# Patient Record
Sex: Female | Born: 1985 | Race: Black or African American | Hispanic: No | Marital: Single | Smoking: Never smoker
Health system: Southern US, Community
[De-identification: ages and names within clinical notes are randomized; demographics above are authoritative.]

## PROBLEM LIST (undated history)

## (undated) DIAGNOSIS — N926 Irregular menstruation, unspecified: Secondary | ICD-10-CM

## (undated) DIAGNOSIS — L0292 Furuncle, unspecified: Secondary | ICD-10-CM

## (undated) DIAGNOSIS — K219 Gastro-esophageal reflux disease without esophagitis: Secondary | ICD-10-CM

## (undated) HISTORY — DX: Gastro-esophageal reflux disease without esophagitis: K21.9

## (undated) HISTORY — DX: Irregular menstruation, unspecified: N92.6

## (undated) HISTORY — DX: Furuncle, unspecified: L02.92

---
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
## 2007-10-30 ENCOUNTER — Emergency Department (HOSPITAL_COMMUNITY): Admission: EM | Admit: 2007-10-30 | Discharge: 2007-10-30 | Payer: Self-pay | Admitting: Emergency Medicine

## 2009-04-21 ENCOUNTER — Other Ambulatory Visit: Admission: RE | Admit: 2009-04-21 | Discharge: 2009-04-21 | Payer: Self-pay | Admitting: Family Medicine

## 2009-04-21 ENCOUNTER — Ambulatory Visit: Payer: Self-pay | Admitting: Family Medicine

## 2009-04-22 ENCOUNTER — Encounter: Payer: Self-pay | Admitting: Family Medicine

## 2009-04-22 LAB — CONVERTED CEMR LAB
ALT: 9 units/L (ref 0–35)
CO2: 25 meq/L (ref 19–32)
Calcium: 9.2 mg/dL (ref 8.4–10.5)
Chloride: 106 meq/L (ref 96–112)
Cholesterol: 154 mg/dL (ref 0–200)
Creatinine, Ser: 0.85 mg/dL (ref 0.40–1.20)
Glucose, Bld: 82 mg/dL (ref 70–99)
Total CHOL/HDL Ratio: 2.9
Total Protein: 7 g/dL (ref 6.0–8.3)

## 2009-05-04 ENCOUNTER — Ambulatory Visit: Payer: Self-pay | Admitting: Family Medicine

## 2009-05-05 ENCOUNTER — Telehealth (INDEPENDENT_AMBULATORY_CARE_PROVIDER_SITE_OTHER): Payer: Self-pay | Admitting: *Deleted

## 2009-05-13 ENCOUNTER — Ambulatory Visit: Payer: Self-pay | Admitting: Family Medicine

## 2010-06-07 NOTE — Letter (Addendum)
Summary: Communicable Disease Form/[facility].  Communicable Disease Form/[facility].   Imported By: Lanelle Bal 05/14/2009 09:45:25  _____________________________________________________________________  External Attachment:    Type:   Image     Comment:   External Document

## 2010-06-07 NOTE — Assessment & Plan Note (Addendum)
Summary: TALK TO DR.METH   Vital Signs:  Patient profile:   25 year old female Height:      66 inches Weight:      172 pounds Pulse rate:   81 / minute BP sitting:   113 / 66  (left arm) Cuff size:   regular  Vitals Entered By: Kathlene November (May 13, 2009 1:03 PM) CC: still having some discharge but not as bad as it was. Was treated last Tuesday for [facility] Clamydia   Primary Care Provider:  Nani Gasser MD  CC:  still having some discharge but not as bad as it was. Was treated last Tuesday for GC Clamydia.  History of Present Illness: still having some discharge but not as bad as it was. Was treated last Tuesday for GC Clamydia. No vaginal irritation, still hust haveing a mild d/c and wants to make sure infection has cleared. Says her boyfriend went to be tested today. They have been using condoms since.  The azithro slurry caused diarrhea.   Current Medications (verified): 1)  Ortho Evra 150-20 Mcg/24hr Ptwk (Norelgestromin-Eth Estradiol) .... Apply One A Week and Change Weekly For 3 Weeks, Then No Patch On The 4th Week.  Allergies (verified): No Known Drug Allergies  Comments:  Nurse/Medical Assistant: The patient's medications and allergies were reviewed with the patient and were updated in the Medication and Allergy Lists. Kathlene November (May 13, 2009 1:04 PM)  Physical Exam  General:  Well-developed,well-nourished,in no acute distress; alert,appropriate and cooperative throughout examination   Impression & Recommendations:  Problem # 1:  GENITOURINARY INFECTION, CHLAMYDIA TRACHOMATIS (ICD-099.55) Will recheck to make sure the infection has cleared.  Orders: T-Chlamydia & GC Probe, Urine ([id number])  Complete Medication List: 1)  Ortho Evra 150-20 Mcg/24hr Ptwk (Norelgestromin-eth estradiol) .... Apply one a week and change weekly for 3 weeks, then no patch on the 4th week.

## 2010-06-30 ENCOUNTER — Encounter: Payer: Self-pay | Admitting: Family Medicine

## 2010-06-30 ENCOUNTER — Other Ambulatory Visit: Payer: Self-pay | Admitting: Family Medicine

## 2010-06-30 ENCOUNTER — Other Ambulatory Visit (HOSPITAL_COMMUNITY)
Admission: RE | Admit: 2010-06-30 | Discharge: 2010-06-30 | Disposition: A | Discharge status: Home / Self Care | Payer: PRIVATE HEALTH INSURANCE | Source: Ambulatory Visit | Attending: Family Medicine | Admitting: Family Medicine

## 2010-06-30 ENCOUNTER — Encounter: Payer: PRIVATE HEALTH INSURANCE | Admitting: Family Medicine

## 2010-06-30 DIAGNOSIS — K219 Gastro-esophageal reflux disease without esophagitis: Secondary | ICD-10-CM | POA: Insufficient documentation

## 2010-06-30 DIAGNOSIS — Z113 Encounter for screening for infections with a predominantly sexual mode of transmission: Secondary | ICD-10-CM | POA: Insufficient documentation

## 2010-06-30 DIAGNOSIS — M25569 Pain in unspecified knee: Secondary | ICD-10-CM

## 2010-06-30 DIAGNOSIS — Z01419 Encounter for gynecological examination (general) (routine) without abnormal findings: Secondary | ICD-10-CM | POA: Insufficient documentation

## 2010-06-30 DIAGNOSIS — N912 Amenorrhea, unspecified: Secondary | ICD-10-CM | POA: Insufficient documentation

## 2010-06-30 DIAGNOSIS — N760 Acute vaginitis: Secondary | ICD-10-CM | POA: Insufficient documentation

## 2010-06-30 LAB — CONVERTED CEMR LAB
Beta hcg, urine, semiquantitative: NEGATIVE
Hemoglobin: 14.7 g/dL (ref 12.0–15.0)
Lymphocytes Relative: 27 % (ref 12–46)
Monocytes Absolute: 0.2 10*3/uL (ref 0.1–1.0)
Monocytes Relative: 2 % — ABNORMAL LOW (ref 3–12)
Neutro Abs: 7.5 10*3/uL (ref 1.7–7.7)
RBC: 4.21 M/uL (ref 3.87–5.11)

## 2010-07-01 ENCOUNTER — Encounter: Payer: Self-pay | Admitting: Family Medicine

## 2010-07-01 LAB — CONVERTED CEMR LAB
BUN: 9 mg/dL (ref 6–23)
CO2: 22 meq/L (ref 19–32)
Calcium: 9.2 mg/dL (ref 8.4–10.5)
Chloride: 106 meq/L (ref 96–112)
Cholesterol: 130 mg/dL (ref 0–200)
Creatinine, Ser: 0.91 mg/dL (ref 0.40–1.20)
HDL: 49 mg/dL (ref >39)
Total CHOL/HDL Ratio: 2.7

## 2010-07-05 NOTE — Assessment & Plan Note (Addendum)
Summary: CPE, GERD, knee pain   Vital Signs:  Patient profile:   25 year old female Height:      66 inches Weight:      175 pounds Pulse rate:   86 / minute BP sitting:   124 / 76  (right arm) Cuff size:   regular  Vitals Entered By: Avon Gully CMA, Duncan Dull) (June 30, 2010 11:36 AM) CC: CPE and pap,no period in 3 months   Chief Complaint:  CPE and pap and no period in 3 months.  History of Present Illness: CPE and pap,no period in 3 months  Having GERD for several months. she describes it as a burning and pressure sensation in her chest.  Especially happens after going out to eat.  Using TUMS. about 1-2 times per week.  she says she definitely has trigger foods like orange juice.  No change in bowels.  No blood in the stool.  No nausea or vomiting.   Hasn't had a period in 3 months.  Usually pretty regular with her periods.  Are your interested in birth control. Occ discomfort in the Left lower quadrant.   she has also had some right knee pain.  His been bothering her for the last several months.  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  She says it bothers her most when she tries to fully flex her knee.  She also notices it bothers her more when the weather is cold.  She said less over the summer she rarely had symptoms.  She doesn't really take any medications or pain relievers for it.  Current Medications (verified): 1)  None  Allergies (verified): No Known Drug Allergies  Comments:  Nurse/Medical Assistant: The patient's medications and allergies were reviewed with the patient and were updated in the Medication and Allergy Lists. Avon Gully CMA, Duncan Dull) (June 30, 2010 11:38 AM)  Past History:  Past Medical History: Last updated: 04/21/2009 None  Past Surgical History: Last updated: 04/21/2009 None  Family History: Last updated: 04/21/2009 Grandparents with DM, Breast Ca, HTN Mother HTN.   Social History: Last updated: 04/21/2009 In school  and working.  Never Smoked Alcohol use-no Drug use-no Regular exercise-no 2 caffeinated drinsk per day.   Review of Systems       The patient complains of severe indigestion/heartburn.  The patient denies anorexia, fever, weight loss, weight gain, vision loss, decreased hearing, hoarseness, chest pain, syncope, dyspnea on exertion, peripheral edema, prolonged cough, headaches, hemoptysis, abdominal pain, melena, hematochezia, hematuria, incontinence, genital sores, muscle weakness, suspicious skin lesions, transient blindness, difficulty walking, depression, unusual weight change, abnormal bleeding, enlarged lymph nodes, angioedema, breast masses, and testicular masses.    Physical Exam  General:  Well-developed,well-nourished,in no acute distress; alert,appropriate and cooperative throughout examination Head:  Normocephalic and atraumatic without obvious abnormalities. No apparent alopecia or balding. Eyes:  No corneal or conjunctival inflammation noted. EOMI. Perrla.  Ears:  External ear exam shows no significant lesions or deformities.  Otoscopic examination reveals clear canals, tympanic membranes are intact bilaterally without bulging, retraction, inflammation or discharge. Hearing is grossly normal bilaterally. Nose:  External nasal examination shows no deformity or inflammation.  Mouth:  Oral mucosa and oropharynx without lesions or exudates.  Teeth in good repair. Neck:  No deformities, masses, or tenderness noted. Chest Wall:  No deformities, masses, or tenderness noted. Breasts:  No mass, nodules, thickening, tenderness, bulging, retraction, inflamation, nipple discharge or skin changes noted.  retracted nipple on the right.  Lungs:  Normal respiratory  effort, chest expands symmetrically. Lungs are clear to auscultation, no crackles or wheezes. Heart:  Normal rate and regular rhythm. S1 and S2 normal without gallop, murmur, click, rub or other extra sounds. Abdomen:  Bowel sounds  positive,abdomen soft and non-tender without masses, organomegaly or hernias noted. Genitalia:  Normal introitus for age, no external lesions, mucosa pink and moist, no vaginal or cervical lesions, no vaginal atrophy, no friaility or hemorrhage, normal uterus size and position, no adnexal masses or tenderness. Thick Hickmon vaginal d/c Msk:  Right knee with crepitus.  No edema.  Nontender around the patella and joint lines.  Normal flexion and extension. Pulses:  Radial 2+  Extremities:  No clubbing, cyanosis, edema, or deformity noted with normal full range of motion of all joints.   Neurologic:  No cranial nerve deficits noted. Station and gait are normal. . Sensory, motor and coordinative functions appear intact. Skin:  no rashes.   Cervical Nodes:  No lymphadenopathy noted Axillary Nodes:  No palpable lymphadenopathy Psych:  Cognition and judgment appear intact. Alert and cooperative with normal attention span and concentration. No apparent delusions, illusions, hallucinations   Impression & Recommendations:  Problem # 1:  ROUTINE GYNECOLOGICAL EXAMINATION (ICD-V72.31) Exam is normal today Will call with the pap resuls. Will check GD, chlam, and vaginitis screen.  encourage regular exercise.she also has lactose intolerance are recommended daily calcium plus thiamine D. supplement.  Problem # 2:  AMENORRHEA (ICD-626.0) she has missed her period for the last last 3 months.  We did do a urine test today which was negative.  She admits she has been very stressed lately and this early could be contributing.  I do recommend she start birth control since she is actually active.  She does not have her period in the next month she's to give [place] a call and I will refer her to GYN for further evaluation for amenorrhea. The following medications were removed from the medication list:    Ortho Evra 150-20 Mcg/24hr Ptwk (Norelgestromin-eth estradiol) .Marland Kitchen... Apply one a week and change weekly for 3 weeks,  then no patch on the 4th week. Her updated medication list for this problem includes:    Sprintec 28 0.25-35 Mg-mcg Tabs (Norgestimate-eth estradiol) .Marland Kitchen... Take 1 tablet by mouth once a day 1  Problem # 3:  GERD (ICD-530.81) discussed that the main treatment is avoiding this trigger foods.  Avoid greasy, spicy, acidic foods.  Avoid caffeine and carbonated beverages.  Avoid over eating and eating right before bedtime.  In the meantime will go ahead and start her on ranitidine b.i.d.  If she does well on this for one month she can decrease to once a day for one month period and then stop the medication or take every other day 4 as needed symptoms.  If this does not control her symptoms within one week she is to let me know. Her updated medication list for this problem includes:    Ranitidine Hcl 150 Mg Tabs (Ranitidine hcl) .Marland Kitchen... Take 1 tablet by mouth two times a day  Problem # 4:  KNEE PAIN (ICD-719.46) and concern that she may ask to have possible cartilage tear that causes pain and inflammation.  Especially with flexion of the knee.  At this point in time I did give her some exercises to do more for patellofemoral syndrome.  She is not missing some premed for the next few weeks she's to give [place] a call and I will refer her to sports medicine orthopedics referralforfurther  evaluation  Complete Medication List: 1)  Sprintec 28 0.25-35 Mg-mcg Tabs (Norgestimate-eth estradiol) .... Take 1 tablet by mouth once a day 1 2)  Ranitidine Hcl 150 Mg Tabs (Ranitidine hcl) .... Take 1 tablet by mouth two times a day  Other Orders: Urine Pregnancy Test  (11914) T-Comprehensive Metabolic Panel [id number]) T-Lipid Profile ([id number]) T-CBC w/Diff ([id number])  Patient Instructions: 1)  Start the knee exercises and if not better in 3 weeks the cal the office. Can use Tylenol for pain.  2)  It is important that you exercise reguarly at least 20 minutes 5 times a week. If you develop chest pain, have  severe difficulty breathing, or feel very tired, stop exercising immediately and seek medical attention.  3)  Take calcium +vitamin D daily.  Prescriptions: RANITIDINE HCL 150 MG TABS (RANITIDINE HCL) Take 1 tablet by mouth two times a day  #60 x 1   Entered and Authorized by:   Nani Gasser MD   Signed by:   Nani Gasser MD on 06/30/2010   Method used:   Electronically to        [facility] [address]* (retail)       [address]       [place], [place]  [address]       Ph: [phone]       Fax: [phone]   RxID:   [id number] SPRINTEC 28 0.25-35 MG-MCG TABS (NORGESTIMATE-ETH ESTRADIOL) Take 1 tablet by mouth once a day 1  #1 pack x 11   Entered and Authorized by:   Nani Gasser MD   Signed by:   Nani Gasser MD on 06/30/2010   Method used:   Electronically to        [facility] [phone]* (retail)       [address]       [place], [place]  [address]       Ph: [phone]       Fax: [phone]   RxID:   [id number]    Orders Added: 1)  Urine Pregnancy Test  [81025] 2)  T-Comprehensive Metabolic Panel [[id number]] 3)  T-Lipid Profile [[id number]] 4)  T-CBC w/Diff [[id number]] 5)  Est. Patient age 71-39 [33] 6)  Est. Patient Level III [99213]    Laboratory Results   Urine Tests  Date/Time Received: 06/30/10 Date/Time Reported: 06/30/10    Urine HCG: negative

## 2010-07-07 ENCOUNTER — Encounter (INDEPENDENT_AMBULATORY_CARE_PROVIDER_SITE_OTHER): Payer: Self-pay | Admitting: *Deleted

## 2010-07-08 ENCOUNTER — Ambulatory Visit: Payer: PRIVATE HEALTH INSURANCE

## 2010-07-11 ENCOUNTER — Ambulatory Visit (INDEPENDENT_AMBULATORY_CARE_PROVIDER_SITE_OTHER): Payer: PRIVATE HEALTH INSURANCE | Admitting: Family Medicine

## 2010-07-11 ENCOUNTER — Encounter: Payer: Self-pay | Admitting: Family Medicine

## 2010-07-11 ENCOUNTER — Telehealth: Payer: Self-pay | Admitting: Family Medicine

## 2010-07-11 DIAGNOSIS — A5609 Other chlamydial infection of lower genitourinary tract: Secondary | ICD-10-CM

## 2010-07-12 ENCOUNTER — Encounter (INDEPENDENT_AMBULATORY_CARE_PROVIDER_SITE_OTHER): Payer: Self-pay | Admitting: *Deleted

## 2010-07-19 NOTE — Progress Notes (Signed)
Summary: work note needed   Phone Note Call from Patient   Caller: Patient Summary of Call: Patient called back to the office because she forgot to get her work note while she was here in the office today. She states she is planning on returning to work on Wednesday 07/13/10. She will pick this note up first thing in the morning--Tuesday, can you please have her note ready for her and left at the front desk for her to pick up? Thanks for your help.Michaelle Copas  July 11, 2010 4:03 PM  Initial call taken by: Michaelle Copas,  July 11, 2010 4:03 PM  Follow-up for Phone Call        OK for work note for today. Can return for tomorrow.  Follow-up by: Nani Gasser MD,  July 11, 2010 4:43 PM  Additional Follow-up for Phone Call Additional follow up Details #1::        note printed Additional Follow-up by: Avon Gully CMA, Duncan Dull),  July 12, 2010 3:28 PM

## 2010-07-19 NOTE — Assessment & Plan Note (Signed)
Summary: infection   Vital Signs:  Patient profile:   25 year old female LMP:     07/06/2010 Height:      66 inches Weight:      173 pounds BMI:     28.02 O2 Sat:      100 % on Room air Temp:     98.6 degrees F oral Pulse rate:   63 / minute BP sitting:   126 / 73  (right arm) Cuff size:   large  Vitals Entered By: Francee Piccolo CMA Duncan Dull) (July 11, 2010 3:20 PM)  O2 Flow:  Room air CC: treatment for positive chlamydia test//SP Is Patient Diabetic? No LMP (date): 07/06/2010     Enter LMP: 07/06/2010 Last PAP Result Normal   Primary Care Provider:  Nani Gasser MD  CC:  treatment for positive chlamydia test//SP.  History of Present Illness: treatment for positive chlamydia test//SP  She finally started her period. Ended yesterday but didn't pick up her OCP until today. Wants to know if can start them today. She cithe metronidazole for BV.  Here for Axithro slurry.   Current Medications (verified): 1)  Sprintec 28 0.25-35 Mg-Mcg Tabs (Norgestimate-Eth Estradiol) .... Take 1 Tablet By Mouth Once A Day 1 2)  Ranitidine Hcl 150 Mg Tabs (Ranitidine Hcl) .... Take 1 Tablet By Mouth Two Times A Day 3)  Diflucan 150 Mg Tabs (Fluconazole) .... Take 1 Tablet By Mouth Once A Day  Allergies (verified): No Known Drug Allergies  Physical Exam  General:  Well-developed,well-nourished,in no acute distress; alert,appropriate and cooperative throughout examination   Impression & Recommendations:  Problem # 1:  CHLAMYDTRACHOMATIS INFECTION LOWER GU SITES (ICD-099.53) WE discussed letting her partner know so they dont' reinfedct each other. Also discussed using condoms.  Orders: Azithromycin oral (Q0144)  Complete Medication List: 1)  Sprintec 28 0.25-35 Mg-mcg Tabs (Norgestimate-eth estradiol) .... Take 1 tablet by mouth once a day 1 2)  Ranitidine Hcl 150 Mg Tabs (Ranitidine hcl) .... Take 1 tablet by mouth two times a day 3)  Diflucan 150 Mg Tabs (Fluconazole)  .... Take 1 tablet by mouth once a day   Orders Added: 1)  Est. Patient Level I [16109] 2)  Azithromycin oral [Q0144]

## 2010-07-19 NOTE — Letter (Signed)
Summary: Out of Work  [facility]  [address]   [place], [place] [address]   Phone: [phone]  Fax: [phone]    July 12, 2010   Employee:  Sharmaine S Wycoff    To Whom It May Concern:   For Medical reasons, please excuse the above named employee from work for the following dates:  Start:   07/11/10  End:    07/11/10   .Patient may return to work on 07/12/10  If you need additional information, please feel free to contact our office.         Sincerely,    Avon Gully CMA, (AAMA)

## 2010-07-19 NOTE — Letter (Signed)
Summary: Out of Work  [facility]  [address], [address]   [place], [place] [address]   Phone: [phone]  Fax: [phone]    July 12, 2010   Employee:  Caitlyn Buck    To Whom It May Concern:   For Medical reasons, please excuse the above named employee from work for the following dates:  Start:   07/11/10  End:   07/12/10.Patient may return to work on 07/13/10  If you need additional information, please feel free to contact our office.         Sincerely,    Charlann Boxer, MD  Appended Document: Out of Work 07/12/10 acm 3:32  printed wrong dates on the first letter.pt can return to work on 07/12/10

## 2012-08-01 ENCOUNTER — Encounter (HOSPITAL_COMMUNITY): Payer: Self-pay | Admitting: *Deleted

## 2012-08-01 ENCOUNTER — Emergency Department (INDEPENDENT_AMBULATORY_CARE_PROVIDER_SITE_OTHER)
Admission: EM | Admit: 2012-08-01 | Discharge: 2012-08-01 | Disposition: A | Discharge status: Home / Self Care | Payer: Self-pay | Source: Home / Self Care

## 2012-08-01 DIAGNOSIS — L02419 Cutaneous abscess of limb, unspecified: Secondary | ICD-10-CM

## 2012-08-01 DIAGNOSIS — L02415 Cutaneous abscess of right lower limb: Secondary | ICD-10-CM

## 2012-08-01 MED ORDER — HYDROCODONE-ACETAMINOPHEN 7.5-325 MG PO TABS: 1.0000 | ORAL_TABLET | ORAL | Status: DC | PRN

## 2012-08-01 MED ORDER — SULFAMETHOXAZOLE-TRIMETHOPRIM 800-160 MG PO TABS: 1.0000 | ORAL_TABLET | Freq: Two times a day (BID) | ORAL | Status: DC

## 2012-08-01 NOTE — ED Notes (Signed)
Pt  Has     Symptoms  Of a   painfull  Swollen     Boil  On the  Base  Of the  r  Buttock  Cheek       Symptoms  X  2  Days        Pt  Reports  Pain on  Palpation       Of  The  Affected  Area

## 2012-08-01 NOTE — Discharge Instructions (Signed)
Abscess An abscess is an infected area that contains a collection of pus and debris.It can occur in almost any part of the body. An abscess is also known as a furuncle or boil. CAUSES  An abscess occurs when tissue gets infected. This can occur from blockage of oil or sweat glands, infection of hair follicles, or a minor injury to the skin. As the body tries to fight the infection, pus collects in the area and creates pressure under the skin. This pressure causes pain. People with weakened immune systems have difficulty fighting infections and get certain abscesses more often.  SYMPTOMS Usually an abscess develops on the skin and becomes a painful mass that is red, warm, and tender. If the abscess forms under the skin, you may feel a moveable soft area under the skin. Some abscesses break open (rupture) on their own, but most will continue to get worse without care. The infection can spread deeper into the body and eventually into the bloodstream, causing you to feel ill.  DIAGNOSIS  Your caregiver will take your medical history and perform a physical exam. A sample of fluid may also be taken from the abscess to determine what is causing your infection. TREATMENT  Your caregiver may prescribe antibiotic medicines to fight the infection. However, taking antibiotics alone usually does not cure an abscess. Your caregiver may need to make a small cut (incision) in the abscess to drain the pus. In some cases, gauze is packed into the abscess to reduce pain and to continue draining the area. HOME CARE INSTRUCTIONS   Only take over-the-counter or prescription medicines for pain, discomfort, or fever as directed by your caregiver.  If you were prescribed antibiotics, take them as directed. Finish them even if you start to feel better.  If gauze is used, follow your caregiver's directions for changing the gauze.  To avoid spreading the infection:  Keep your draining abscess covered with a  bandage.  Wash your hands well.  Do not share personal care items, towels, or whirlpools with others.  Avoid skin contact with others.  Keep your skin and clothes clean around the abscess.  Keep all follow-up appointments as directed by your caregiver. SEEK MEDICAL CARE IF:   You have increased pain, swelling, redness, fluid drainage, or bleeding.  You have muscle aches, chills, or a general ill feeling.  You have a fever. MAKE SURE YOU:   Understand these instructions.  Will watch your condition.  Will get help right away if you are not doing well or get worse. Document Released: 02/01/2005 Document Revised: 10/24/2011 Document Reviewed: 07/07/2011 [facility] Patient Information 2013 [employer] [place], [place].  Community-Associated MRSA CA-MRSA stands for community-associated methicillin-resistant Staphylococcus aureus. MRSA is a type of bacteria that is resistant to some common antibiotics. It can cause infections in the skin and many other places in the body. Staphylococcus aureus, often called "staph," is a bacteria that normally lives on the skin or in the nose. Staph on the surface of the skin or in the nose does not cause problems. However, if the staph enters the body through a cut, wound, or break in the skin, an infection can happen. Up until recently, infections with the MRSA type of staph mainly occurred in hospitals and other healthcare settings. There are now increasing problems with MRSA infections in the community as well. Infections with MRSA may be very serious or even life-threatening. CA-MRSA is becoming more common. It is known to spread in crowded settings, in jails and prisons, and  in situations where there is close skin-to-skin contact, such as during sporting events or in locker rooms. MRSA can be spread through shared items, such as children's toys, razors, towels, or sports equipment.  °CAUSES °All staph, including MRSA, are normally harmless unless they enter the body  through a scratch, cut, or wound, such as with surgery. All staph, including MRSA, can be spread from person-to-person by touching contaminated objects or through direct contact. °· MRSA now causes illness in people who have not been in hospitals or other healthcare facilities. Cases of MRSA diseases in the community have been associated with: °· Recent antibiotic use. °· Sharing contaminated towels or clothes. °· Having active skin diseases. °· Participating in contact sports. °· Living in crowded settings. °· Intravenous (IV) drug use. °· Community-associated MRSA infections are usually skin infections, but may cause other severe illnesses. °· Staph bacteria are one of the most common causes of skin infection. However, they are also a common cause of pneumonia, bone or joint infections, and bloodstream infections. °DIAGNOSIS °Diagnosis of MRSA is done by cultures of fluid samples that may come from: °· Swabs taken from cuts or wounds in infected areas. °· Nasal swabs. °· Saliva or deep cough specimens from the lungs (sputum). °· Urine. °· Blood. °Many people are "colonized" with MRSA but have no signs of infection. This means that people carry the MRSA germ on their skin or in their nose and may never develop MRSA infection.  °TREATMENT  °Treatment varies and is based on how serious, how deep, or how extensive the infection is. For example: °· Some skin infections, such as a small boil or abscess, may be treated by draining yellowish-Bonura fluid (pus) from the site of the infection. °· Deeper or more widespread soft tissue infections are usually treated with surgery to drain pus and with antibiotic medicine given by vein or by mouth. This may be recommended even if you are pregnant. °· Serious infections may require a hospital stay. °If antibiotics are given, they may be needed for several weeks. °PREVENTION °Because many people are colonized with staph, including MRSA, preventing the spread of the bacteria from  person-to-person is most important. The best way to prevent the spread of bacteria and other germs is through proper hand washing or by using alcohol-based hand disinfectants. The following are other ways to help prevent MRSA infection within community settings.  °· Wash your hands frequently with soap and water for at least 15 seconds. Otherwise, use alcohol-based hand disinfectants when soap and water is not available. °· Make sure people who live with you wash their hands often, too. °· Do not share personal items. For example, avoid sharing razors and other personal hygiene items, towels, clothing, and athletic equipment. °· Wash and dry your clothes and bedding at the warmest temperatures recommended on the labels. °· Keep wounds covered. Pus from infected sores may contain MRSA and other bacteria. Keep cuts and abrasions clean and covered with germ-free (sterile), dry bandages until they are healed. °· If you have a wound that appears infected, ask your caregiver if a culture for MRSA and other bacteria should be done. °· If you are breastfeeding, talk to your caregiver about MRSA. You may be asked to temporarily stop breastfeeding. °HOME CARE INSTRUCTIONS  °· Take your antibiotics as directed. Finish them even if you start to feel better. °· Avoid close contact with those around you as much as possible. Do not use towels, razors, toothbrushes, bedding, or other items that   will be used by others.  To fight the infection, follow your caregiver's instructions for wound care. Wash your hands before and after changing your bandages.  If you have an intravascular device, such as a catheter, make sure you know how to care for it.  Be sure to tell any healthcare providers that you have MRSA so they are aware of your infection. SEEK IMMEDIATE MEDICAL CARE IF:  The infection appears to be getting worse. Signs include:  Increased warmth, redness, or tenderness around the wound site.  A red line that extends  from the infection site.  A dark color in the area around the infection.  Wound drainage that is tan, yellow, or green.  A bad smell coming from the wound.  You feel sick to your stomach (nauseous) and throw up (vomit) or cannot keep medicine down.  You have a fever.  Your baby is older than 3 months with a rectal temperature of 102 F (38.9 C) or higher.  Your baby is 38 months old or younger with a rectal temperature of 100.4 F (38 C) or higher.  You have difficulty breathing. MAKE SURE YOU:   Understand these instructions.  Will watch your condition.  Will get help right away if you are not doing well or get worse. Document Released: 07/28/2005 Document Revised: 07/17/2011 Document Reviewed: 07/28/2010 [facility] Patient Information 2013 [place], [place].

## 2012-08-01 NOTE — ED Provider Notes (Signed)
History     CSN: [id number]  Arrival date & time 08/01/12  1617   First MD Initiated Contact with Patient 08/01/12 1638      Chief Complaint  Patient presents with  . Recurrent Skin Infections    (Consider location/radiation/quality/duration/timing/severity/associated sxs/prior treatment) HPI Comments: 27 year old female presents with a painful skin lesion to the proximal right inner thigh. This began approximately 2 days ago. No history of trauma. Denies constitutional symptoms such as fever, chills or malaise.   History reviewed. No pertinent past medical history.  History reviewed. No pertinent past surgical history.  No family history on file.  History  Substance Use Topics  . Smoking status: Never Smoker   . Smokeless tobacco: Not on file  . Alcohol Use: No    OB History   Grav Para Term Preterm Abortions TAB SAB Ect Mult Living                  Review of Systems  Constitutional: Negative.   Respiratory: Negative.   Gastrointestinal: Negative.   Genitourinary: Negative.   Skin: Negative for rash.       See history of present illness  Neurological: Negative.   Psychiatric/Behavioral: Negative.     Allergies  Review of patient's allergies indicates no known allergies.  Home Medications   Current Outpatient Rx  Name  Route  Sig  Dispense  Refill  . HYDROcodone-acetaminophen (NORCO) 7.5-325 MG per tablet   Oral   Take 1 tablet by mouth every 4 (four) hours as needed for pain.   15 tablet   0   . sulfamethoxazole-trimethoprim (SEPTRA DS) 800-160 MG per tablet   Oral   Take 1 tablet by mouth 2 (two) times daily. X 7 days   14 tablet   0     BP 122/80  Pulse 108  Temp(Src) 99.9 F (37.7 C) (Oral)  Resp 20  SpO2 100%  LMP 07/14/2012  Physical Exam  Constitutional: She is oriented to person, place, and time. She appears well-developed and well-nourished. No distress.  Neck: Neck supple.  Cardiovascular: Normal rate.   Pulmonary/Chest:  Effort normal.  Musculoskeletal: She exhibits no edema and no tenderness.  Neurological: She is alert and oriented to person, place, and time. She exhibits normal muscle tone.  Skin: Skin is warm and dry.  Red indurated area measuring approximately 7 cm in diameter located in the proximal inner thigh, just distal to the inguinal fold. Much tenderness. There is a fistula draining small drops of purulent exudate. No extending lymphangitis or cellulitis. No surrounding lymphadenopathy.  Psychiatric: Her behavior is normal.    ED Course  INCISION AND DRAINAGE Date/Time: 08/01/2012 5:45 PM Performed by: Phineas Real, Nazaiah Navarrete Authorized by: Bradd Canary D Consent: Verbal consent obtained. Risks and benefits: risks, benefits and alternatives were discussed Consent given by: patient Patient understanding: patient states understanding of the procedure being performed Patient identity confirmed: verbally with patient Type: abscess Body area: lower extremity Location details: right leg Anesthesia: local infiltration Local anesthetic: lidocaine 2% with epinephrine Anesthetic total: 9 ml Patient sedated: no Scalpel size: 11 Incision type: single with marsupialization Complexity: complex Drainage: purulent and bloody Drainage amount: copious Wound treatment: drain placed Packing material: 1/4 in gauze   (including critical care time)  Labs Reviewed  CULTURE, ROUTINE-ABSCESS   No results found.   1. Abscess of right thigh       MDM  Septra DS one twice a day for 7 days Norco 7.5 one every 4 hours  when necessary pain #15 Keep the wound clean and dry. Return in 2 days for wound check and packing removal. Return sooner for worsening or new problems.        Hayden Rasmussen, NP 08/01/12 1810

## 2012-08-02 NOTE — ED Provider Notes (Signed)
Medical screening examination/treatment/procedure(s) were performed by resident physician or non-physician practitioner and as supervising physician I was immediately available for consultation/collaboration.   Barkley Bruns MD.   Linna Hoff, MD 08/02/12 [phone]

## 2012-08-03 ENCOUNTER — Encounter (HOSPITAL_COMMUNITY): Payer: Self-pay | Admitting: Emergency Medicine

## 2012-08-03 ENCOUNTER — Emergency Department (INDEPENDENT_AMBULATORY_CARE_PROVIDER_SITE_OTHER)
Admission: EM | Admit: 2012-08-03 | Discharge: 2012-08-03 | Disposition: A | Discharge status: Home / Self Care | Payer: Self-pay | Source: Home / Self Care | Attending: Family Medicine | Admitting: Family Medicine

## 2012-08-03 DIAGNOSIS — Z5189 Encounter for other specified aftercare: Secondary | ICD-10-CM

## 2012-08-03 MED ORDER — HYDROCODONE-ACETAMINOPHEN 5-325 MG PO TABS: 1.0000 | ORAL_TABLET | ORAL | Status: DC | PRN

## 2012-08-03 NOTE — ED Provider Notes (Signed)
History     CSN: [id number]  Arrival date & time 08/03/12  1214   First MD Initiated Contact with Patient 08/03/12 1359      Chief Complaint  Patient presents with  . Follow-up    HPI: Patient is a 27 y.o. female presenting with wound check. The history is provided by the spouse.  Wound Check This is a new problem. The current episode started more than 2 days ago. The problem has been gradually improving.  Pt seen here 2 days ago (08/01/12) for painful lesion to (R) inner thigh. I&D was performed and pt was placed on Septra DS. Pt states she has been taking her antibiotic as directed. Has noted some improvement in amount of pain but area remains TTP.   History reviewed. No pertinent past medical history.  History reviewed. No pertinent past surgical history.  No family history on file.  History  Substance Use Topics  . Smoking status: Never Smoker   . Smokeless tobacco: Not on file  . Alcohol Use: No    OB History   Grav Para Term Preterm Abortions TAB SAB Ect Mult Living                  Review of Systems  All other systems reviewed and are negative.    Allergies  Review of patient's allergies indicates no known allergies.  Home Medications   Current Outpatient Rx  Name  Route  Sig  Dispense  Refill  . HYDROcodone-acetaminophen (NORCO) 7.5-325 MG per tablet   Oral   Take 1 tablet by mouth every 4 (four) hours as needed for pain.   15 tablet   0   . sulfamethoxazole-trimethoprim (SEPTRA DS) 800-160 MG per tablet   Oral   Take 1 tablet by mouth 2 (two) times daily. X 7 days   14 tablet   0     BP 131/88  Pulse 116  Temp(Src) 99.1 F (37.3 C) (Oral)  Resp 20  SpO2 98%  LMP 07/14/2012  Physical Exam  Constitutional: She is oriented to person, place, and time. She appears well-developed and well-nourished.  HENT:  Head: Normocephalic and atraumatic.  Eyes: Conjunctivae are normal.  Cardiovascular: Normal rate.   Pulmonary/Chest: Effort normal.   Musculoskeletal: Normal range of motion.  Neurological: She is alert and oriented to person, place, and time.  Skin: Skin is warm and dry.  Packing removed. Wound to (R) inner thigh continues to drain. Predominately serous drainage. Minimal erythema, some local induration remains. Remains TTP  Psychiatric: She has a normal mood and affect.    ED Course  Procedures (including critical care time)  Labs Reviewed - No data to display No results found.   No diagnosis found.    MDM  Packing removed. Pt instructed to start warm soaks 2 to 3 times a day (at least) and to keep bulky dressing in place except when soaking. Given size of wound and remaining induration and TTP, will have pt return Monday for recheck to assure continued improvement. Instructed to continue antibiotic.         Leanne Chang, NP 08/03/12 1427

## 2012-08-03 NOTE — ED Notes (Signed)
Pt is here for a 2 day f/u and have packing removed from right glut She is alert and oriented w/no signs of acute distress.

## 2012-08-04 LAB — CULTURE, ROUTINE-ABSCESS

## 2012-08-04 NOTE — ED Provider Notes (Signed)
Medical screening examination/treatment/procedure(s) were performed by resident physician or non-physician practitioner and as supervising physician I was immediately available for consultation/collaboration.   Barkley Bruns MD.   Linna Hoff, MD 08/04/12 1106

## 2012-08-05 ENCOUNTER — Encounter (HOSPITAL_COMMUNITY): Payer: Self-pay | Admitting: Emergency Medicine

## 2012-08-05 ENCOUNTER — Emergency Department (INDEPENDENT_AMBULATORY_CARE_PROVIDER_SITE_OTHER)
Admission: EM | Admit: 2012-08-05 | Discharge: 2012-08-05 | Disposition: A | Discharge status: Home / Self Care | Payer: Self-pay | Source: Home / Self Care

## 2012-08-05 DIAGNOSIS — Z09 Encounter for follow-up examination after completed treatment for conditions other than malignant neoplasm: Secondary | ICD-10-CM

## 2012-08-05 NOTE — ED Provider Notes (Signed)
History     CSN: [id number]  Arrival date & time 08/05/12  1558   None     Chief Complaint  Patient presents with  . Wound Check    (Consider location/radiation/quality/duration/timing/severity/associated sxs/prior treatment) HPI Comments: For wound check #2 status post I&D of abscess. The patient has no complaints, she has been soaking in warm water. She continues to take her antibiotics.  Patient is a 27 y.o. female presenting with wound check.  Wound Check    History reviewed. No pertinent past medical history.  History reviewed. No pertinent past surgical history.  No family history on file.  History  Substance Use Topics  . Smoking status: Never Smoker   . Smokeless tobacco: Not on file  . Alcohol Use: No    OB History   Grav Para Term Preterm Abortions TAB SAB Ect Mult Living                  Review of Systems  All other systems reviewed and are negative.    Allergies  Review of patient's allergies indicates no known allergies.  Home Medications   Current Outpatient Rx  Name  Route  Sig  Dispense  Refill  . HYDROcodone-acetaminophen (NORCO) 7.5-325 MG per tablet   Oral   Take 1 tablet by mouth every 4 (four) hours as needed for pain.   15 tablet   0   . HYDROcodone-acetaminophen (NORCO/VICODIN) 5-325 MG per tablet   Oral   Take 1 tablet by mouth every 4 (four) hours as needed for pain.   10 tablet   0   . sulfamethoxazole-trimethoprim (SEPTRA DS) 800-160 MG per tablet   Oral   Take 1 tablet by mouth 2 (two) times daily. X 7 days   14 tablet   0     BP 115/67  Pulse 86  Temp(Src) 98.3 F (36.8 C) (Oral)  Resp 18  SpO2 99%  LMP 07/14/2012  Physical Exam  Nursing note and vitals reviewed. Constitutional: She is oriented to person, place, and time. She appears well-developed.  Neurological: She is alert and oriented to person, place, and time. She exhibits normal muscle tone.  Skin: Skin is warm and dry. No erythema.  Wound is  healing well without signs of infection. There is some maceration of the incision most likely due to soaking in water.    ED Course  Procedures (including critical care time)  Labs Reviewed - No data to display No results found.   1. Follow-up examination, following unspecified surgery       MDM  The area of the abscess is continuing to heal. There is a 2 and half centimeter incision opening that is filling in by secondary intention. No signs of infection such is Prelone drainage. No bleeding. Just beneath this incision is an area of induration measuring approximately 3 x 2 cm. It is getting smaller. No extension into the gluteal cleft or distally.        Hayden Rasmussen, NP 08/05/12 1737

## 2012-08-05 NOTE — ED Notes (Signed)
Follow up on abscess, right buttocks. Patient reports site feels much better, some soreness, but much better.

## 2012-08-05 NOTE — ED Provider Notes (Signed)
Medical screening examination/treatment/procedure(s) were performed by non-physician practitioner and as supervising physician I was immediately available for consultation/collaboration.  Raynald Blend, MD 08/05/12 [phone]

## 2012-08-09 ENCOUNTER — Telehealth (HOSPITAL_COMMUNITY): Payer: Self-pay | Admitting: *Deleted

## 2012-08-09 NOTE — ED Notes (Signed)
Abscess culture R thigh: few MRSA.  Pt. adequately treated with Septra DS. I called pt. Pt. verified x 2 and given results.  Pt. told she was adequately treated and given the [facility] MRSA instructions. Pt. 's questions answered and  She voiced understanding. Vassie Moselle 08/09/2012

## 2013-01-10 ENCOUNTER — Ambulatory Visit (INDEPENDENT_AMBULATORY_CARE_PROVIDER_SITE_OTHER): Payer: No Typology Code available for payment source | Admitting: Family Medicine

## 2013-01-10 ENCOUNTER — Encounter: Payer: Self-pay | Admitting: Family Medicine

## 2013-01-10 VITALS — BP 110/84 | Temp 98.7°F | Ht 65.5 in | Wt 184.0 lb

## 2013-01-10 DIAGNOSIS — N926 Irregular menstruation, unspecified: Secondary | ICD-10-CM | POA: Insufficient documentation

## 2013-01-10 DIAGNOSIS — K5900 Constipation, unspecified: Secondary | ICD-10-CM | POA: Insufficient documentation

## 2013-01-10 DIAGNOSIS — E669 Obesity, unspecified: Secondary | ICD-10-CM

## 2013-01-10 DIAGNOSIS — Z23 Encounter for immunization: Secondary | ICD-10-CM

## 2013-01-10 DIAGNOSIS — Z7189 Other specified counseling: Secondary | ICD-10-CM

## 2013-01-10 DIAGNOSIS — Z800 Family history of malignant neoplasm of digestive organs: Secondary | ICD-10-CM

## 2013-01-10 DIAGNOSIS — Z7689 Persons encountering health services in other specified circumstances: Secondary | ICD-10-CM

## 2013-01-10 LAB — TSH: TSH: 0.39 u[IU]/mL (ref 0.35–5.50)

## 2013-01-10 LAB — LIPID PANEL
Cholesterol: 161 mg/dL (ref 0–200)
VLDL: 17.4 mg/dL (ref 0.0–40.0)

## 2013-01-10 LAB — HEMOGLOBIN A1C: Hgb A1c MFr Bld: 3.8 % — ABNORMAL LOW (ref 4.6–6.5)

## 2013-01-10 LAB — T4, FREE: Free T4: 0.77 ng/dL (ref 0.60–1.60)

## 2013-01-10 NOTE — Patient Instructions (Signed)
-  We have ordered labs or studies at this visit. It can take up to 1-2 weeks for results and processing. We will contact you with instructions IF your results are abnormal. Normal results will be released to your [facility]. If you have not heard from [place] or can not find your results in [facility] in 2 weeks please contact our office.  -PLEASE SIGN UP FOR MYCHART TODAY   We recommend the following healthy lifestyle measures: - eat a healthy diet consisting of lots of vegetables, fruits, beans, nuts, seeds, healthy meats such as Schrag chicken and fish and whole grains.  - avoid fried foods, fast food, processed foods, sodas, red meet and other fattening foods.  - get a least 150 minutes of aerobic exercise per week.   Please let us know if period irregularity returns/worsens  -We placed a referral for you as discussed to the gastroenterologist. It usually takes about 1-2 weeks to process and schedule this referral. If you have not heard from [place] regarding this appointment in 2 weeks please contact our office.  In the meantime for your constipation: -we advise a daily fiber supplement such as metamucil -when stopped up: mirilax 1 capful in cup of liquid daily for 3 days, if not successful increase to twice daily for 3 days - goal is 1 soft bowel movement at least several times per week  Follow up in: 3 months

## 2013-01-10 NOTE — Progress Notes (Signed)
Chief Complaint  Patient presents with  . Establish Care  . Menstrual Problem    irregularity   . Constipation    HPI:  Caitlyn Buck is here to establish care. Moved to [place] recently. Last PCP and physical:  Has the following chronic problems and concerns today:  Mestrual Irr: -irregular since started periods as a teenager -Occ misses periods -missed a few periods recently the started her period today  Constipation/GERD: -chronic - her whole life even as a child had to use laxatives -does not take a fiber supplement -no changes, no blood in stools, no nausea or vomiting -GERD is rare - tums helps -multiple family members with colon cancer - would like to see GI  Patient Active Problem List   Diagnosis Date Noted  . Unspecified constipation 01/10/2013  . Family hx of colon cancer 01/10/2013  . Obesity, unspecified 01/10/2013  . Irregular menses 01/10/2013  . GERD 06/30/2010   Health Maintenance: -she will get flu flu and tdap  -had pap last year and was normal, reports never had an abnormal pap  ROS: See pertinent positives and negatives per HPI.  Past Medical History  Diagnosis Date  . GERD (gastroesophageal reflux disease)   . Menstrual irregularity     Family History  Problem Relation Age of Onset  . Cancer Mother 26    colon cancer  . Hypertension Mother   . Diabetes Maternal Grandmother   . Cancer Maternal Grandfather     colon cancer    History   Social History  . Marital Status: Single    Spouse Name: N/A    Number of Children: N/A  . Years of Education: N/A   Social History Main Topics  . Smoking status: Never Smoker   . Smokeless tobacco: None  . Alcohol Use: Yes     Comment: occ. 2-3 drinks a few times per month  . Drug Use: None  . Sexual Activity: Yes    Birth Control/ Protection: Condom   Other Topics Concern  . 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  EXAM:  Filed Vitals:   01/10/13 1433  BP: 110/84  Temp: 98.7 F (37.1 C)    Body mass index is 30.14 kg/(m^2).  GENERAL: vitals reviewed and listed above, alert, oriented, appears well hydrated and in no acute distress  HEENT: atraumatic, conjunttiva clear, no obvious abnormalities on inspection of external nose and ears  NECK: no obvious masses on inspection  LUNGS: clear to auscultation bilaterally, no wheezes, rales or rhonchi, good air movement  CV: HRRR, no peripheral edema  MS: moves all extremities without noticeable abnormality  PSYCH: pleasant and cooperative, no obvious depression or anxiety  ASSESSMENT AND PLAN:  Discussed the following assessment and plan:  Unspecified constipation - Plan: Ambulatory referral to Gastroenterology  Family hx of colon cancer - Plan: Ambulatory referral to Gastroenterology  Obesity, unspecified - Plan: Lipid Panel, TSH, T4, Free, Hemoglobin A1c  Irregular menses - Plan: TSH, T4, Free, POCT urine pregnancy  Encounter to establish care  -We reviewed the PMH, PSH, FH, SH, Meds and Allergies. -We provided refills  for any medications we will prescribe as needed. -We addressed current concerns per orders and patient instructions. -We have asked for records for pertinent exams, studies, vaccines and notes from previous providers. -We have advised patient to follow up per instructions below. -missed period occ for chronically with prior workup and used OCPs in the past, but doesn't like that they cause weight gain - discussed options, chacking upreg and tsh today, offered ocp re-initiation, but she will hold off on this - advised healthy diet, exercise and weight loss -directions for constipation and referred to GI per her  request -lifestyle recs discussed at lengthed -flu and tdap given -advised her to have pap records sent to [place] -follow up 3 months or sooner if needed   -Patient advised to return or notify a doctor immediately if symptoms worsen or persist or new concerns arise.  Patient Instructions  -We have ordered labs or studies at this visit. It can take up to 1-2 weeks for results and processing. We will contact you with instructions IF your results are abnormal. Normal results will be released to your [facility]. If you have not heard from [place] or can not find your results in [facility] in 2 weeks please contact our office.  -PLEASE SIGN UP FOR MYCHART TODAY   We recommend the following healthy lifestyle measures: - eat a healthy diet consisting of lots of vegetables, fruits, beans, nuts, seeds, healthy meats such as Mangini chicken and fish and whole grains.  - avoid fried foods, fast food, processed foods, sodas, red meet and other fattening foods.  - get a least 150 minutes of aerobic exercise per week.   Please let us know if period irregularity returns/worsens  -We placed a referral for you as discussed to the gastroenterologist. It usually takes about 1-2 weeks to process and schedule this referral. If you have not heard from [place] regarding this appointment in 2 weeks please contact our office.  In the meantime for your constipation: -we advise a daily fiber supplement such as metamucil -when stopped up: mirilax 1 capful in cup of liquid daily for 3 days, if not successful increase to twice daily for 3 days - goal is 1 soft bowel movement at least several times per week  Follow up in: 3 months      Diondre Pulis R.

## 2013-01-10 NOTE — Addendum Note (Signed)
Addended by: Azucena Freed on: 01/10/2013 03:19 PM   Modules accepted: Orders

## 2013-01-15 NOTE — Progress Notes (Signed)
Quick Note:  Called and spoke with pt and pt is aware. ______ 

## 2013-01-29 ENCOUNTER — Encounter: Payer: Self-pay | Admitting: Gastroenterology

## 2013-03-07 ENCOUNTER — Ambulatory Visit (INDEPENDENT_AMBULATORY_CARE_PROVIDER_SITE_OTHER): Payer: No Typology Code available for payment source | Admitting: Gastroenterology

## 2013-03-07 ENCOUNTER — Encounter: Payer: Self-pay | Admitting: Gastroenterology

## 2013-03-07 VITALS — BP 100/64 | HR 88 | Ht 65.5 in | Wt 182.6 lb

## 2013-03-07 DIAGNOSIS — K5900 Constipation, unspecified: Secondary | ICD-10-CM

## 2013-03-07 DIAGNOSIS — Z800 Family history of malignant neoplasm of digestive organs: Secondary | ICD-10-CM

## 2013-03-07 MED ORDER — LINACLOTIDE 145 MCG PO CAPS: 145.0000 ug | ORAL_CAPSULE | Freq: Every day | ORAL | Status: DC

## 2013-03-07 MED ORDER — MOVIPREP 100 G PO SOLR: 1.0000 | Freq: Once | ORAL | Status: DC

## 2013-03-07 NOTE — Patient Instructions (Signed)
You will be set up for a colonoscopy (LEC, moderate) for constipation and family history of colon cancer. Samples of linzess (low dose, 2 weeks, if good result we will call in prescription)

## 2013-03-07 NOTE — Progress Notes (Signed)
HPI: This is a    very pleasant 27 year old woman whom I am meeting for the first time today.  Constipated for many years.  Can usually go about 1 week without BM. Does not take regular bowel agents but does have periodic, PRN miralax.  Will have to strain a bit with BMs at first.    Never diarrhea.  . Overall stable weight Feels better, bloating, cramping gone after BM. Has sensation of incomplete evacuation.  Mom had colon cancer, diagnosed at age 31; treated with surgery.  we discussed this cancer at length. Her mom is being treated at [facility]. They explained that she has adenocarcinoma in her pelvis. It sounds as if there has been some diagnostic confusion or dilemma but they ultimately had decided that she has an unusual presentation of colon cancer rather than a gynecologic process.    Review of systems: Pertinent positive and negative review of systems were noted in the above HPI section. Complete review of systems was performed and was otherwise normal.    Past Medical History  Diagnosis Date  . GERD (gastroesophageal reflux disease)   . Menstrual irregularity     History reviewed. No pertinent past surgical history.  No current outpatient prescriptions on file.   No current facility-administered medications for this visit.    Allergies as of 03/07/2013  . (No Known Allergies)    Family History  Problem Relation Age of Onset  . Cancer Mother 25    colon cancer  . Hypertension Mother   . Diabetes Maternal Grandmother   . Cancer Maternal Grandfather     colon cancer    History   Social History  . Marital Status: Single    Spouse Name: N/A    Number of Children: N/A  . Years of Education: N/A   Occupational History  . work    Social History Main Topics  . Smoking status: Never Smoker   . Smokeless tobacco: Never Used  . Alcohol Use: Yes     Comment: occ. 2-3 drinks a few times per month  . Drug Use: No  . Sexual Activity: Yes     Birth Control/ Protection: Condom   Other Topics Concern  . 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 - [employer]      Home Situation: lives with mother      Spiritual Beliefs: Ephriam Knuckles      Lifestyle: walks 1 mile 2-3 times per week; diet is not healthy                Physical Exam: BP 100/64  Pulse 88  Ht 5' 5.5" (1.664 m)  Wt 182 lb 9.6 oz (82.827 kg)  BMI 29.91 kg/m2  SpO2 99%  LMP 02/13/2013 Constitutional: generally well-appearing Psychiatric: alert and oriented x3 Eyes: extraocular movements intact Mouth: oral pharynx moist, no lesions Neck: supple no lymphadenopathy Cardiovascular: heart regular rate and rhythm Lungs: clear to auscultation bilaterally Abdomen: soft, nontender, nondistended, no obvious ascites, no peritoneal signs, normal bowel sounds Extremities: no lower extremity edema bilaterally Skin: no lesions on visible extremities    Assessment and plan: 27 y.o. female with  constipation, family history of colon cancer in her mother at age 25.  We'll proceed with colonoscopy at her soonest convenience given her constipation and her significant family history of colon cancer. I am giving her samples of linzess to take 1 pill once daily and if  she notice improvement on have to get her a prescription for this.. I see no reason for any blood tests or imaging studies at this point.

## 2013-03-12 ENCOUNTER — Encounter: Payer: Self-pay | Admitting: Gastroenterology

## 2013-04-18 ENCOUNTER — Ambulatory Visit: Payer: No Typology Code available for payment source | Admitting: Family Medicine

## 2013-04-25 ENCOUNTER — Other Ambulatory Visit: Payer: Self-pay

## 2013-04-25 ENCOUNTER — Encounter: Payer: Self-pay | Admitting: Gastroenterology

## 2013-04-25 ENCOUNTER — Ambulatory Visit (AMBULATORY_SURGERY_CENTER): Payer: No Typology Code available for payment source | Admitting: Gastroenterology

## 2013-04-25 VITALS — BP 105/60 | HR 71 | Temp 97.3°F | Resp 20 | Ht 65.5 in | Wt 182.0 lb

## 2013-04-25 DIAGNOSIS — D126 Benign neoplasm of colon, unspecified: Secondary | ICD-10-CM

## 2013-04-25 DIAGNOSIS — Z800 Family history of malignant neoplasm of digestive organs: Secondary | ICD-10-CM

## 2013-04-25 MED ORDER — SODIUM CHLORIDE 0.9 % IV SOLN: 500.0000 mL | INTRAVENOUS | Status: DC

## 2013-04-25 MED ORDER — LINACLOTIDE 145 MCG PO CAPS: 145.0000 ug | ORAL_CAPSULE | Freq: Every day | ORAL | Status: DC

## 2013-04-25 NOTE — Op Note (Signed)
[facility] [address] [place], [address]   COLONOSCOPY PROCEDURE REPORT  PATIENT: Caitlyn Buck, Caitlyn Buck  MR#: [id number] BIRTHDATE: 03/31/1986 , 27  yrs. old GENDER: Female ENDOSCOPIST: Rachael Fee, MD REFERRED [id number] Selena Batten, D.O. PROCEDURE DATE:  04/25/2013 PROCEDURE:   Colonoscopy with snare polypectomy First Screening Colonoscopy - Avg.  risk and is 50 yrs.  old or older - No.  Prior Negative Screening - Now for repeat screening. N/A  History of Adenoma - Now for follow-up colonoscopy & has been > or = to 3 yrs.  N/A  Polyps Removed Today? Yes. ASA CLASS:   Class I INDICATIONS:elevated risk screening, mother had colon cancer diagnosed in her early 21s; also minor constipation. MEDICATIONS: Fentanyl 50 mcg IV, Versed 6 mg IV, and These medications were titrated to patient response per physician's verbal order  DESCRIPTION OF PROCEDURE:   After the risks benefits and alternatives of the procedure were thoroughly explained, informed consent was obtained.  A digital rectal exam revealed no abnormalities of the rectum.   The LB [id number]-[id number]  endoscope was introduced through the anus and advanced to the cecum, which was identified by both the appendix and ileocecal valve. No adverse events experienced.   The quality of the prep was excellent.  The instrument was then slowly withdrawn as the colon was fully examined.   COLON FINDINGS: One polyp was found, removed and sent to pathology. This was sessile, located in ascending segment, 3mm across, removed with cold snare.  The examination was otherwise normal. Retroflexed views revealed no abnormalities. The time to cecum=1 minutes 07 seconds.  Withdrawal time=7 minutes 32 seconds.  The scope was withdrawn and the procedure completed. COMPLICATIONS: There were no complications.  ENDOSCOPIC IMPRESSION: One polyp was found, removed and sent to pathology. The examination was otherwise  normal.  RECOMMENDATIONS: If the polyp(s) removed today are proven to be adenomatous (pre-cancerous) polyps, you will need a repeat colonoscopy in 5 years.  You will receive a letter within 1-2 weeks with the results of your biopsy as well as final recommendations.  Please call my office if you have not received a letter after 3 weeks.   eSigned:  Rachael Fee, MD 04/25/2013 8:15 AM

## 2013-04-25 NOTE — Progress Notes (Signed)
Patient did not have preoperative order for IV antibiotic SSI prophylaxis. (G8918)Patient did not have preoperative order for IV antibiotic SSI prophylaxis. (G8918)Patient did not experience any of the following events: a burn prior to discharge; a fall within the facility; wrong site/side/patient/procedure/implant event; or a hospital transfer or hospital admission upon discharge from the facility. (G8907) 

## 2013-04-25 NOTE — Patient Instructions (Signed)
YOU HAD AN ENDOSCOPIC PROCEDURE TODAY AT THE [facility]: Refer to the procedure report that was given to you for any specific questions about what was found during the examination.  If the procedure report does not answer your questions, please call your gastroenterologist to clarify.  If you requested that your care partner not be given the details of your procedure findings, then the procedure report has been included in a sealed envelope for you to review at your convenience later.  YOU SHOULD EXPECT: Some feelings of bloating in the abdomen. Passage of more gas than usual.  Walking can help get rid of the air that was put into your GI tract during the procedure and reduce the bloating. If you had a lower endoscopy (such as a colonoscopy or flexible sigmoidoscopy) you may notice spotting of blood in your stool or on the toilet paper. If you underwent a bowel prep for your procedure, then you may not have a normal bowel movement for a few days.  DIET: Your first meal following the procedure should be a light meal and then it is ok to progress to your normal diet.  A half-sandwich or bowl of soup is an example of a good first meal.  Heavy or fried foods are harder to digest and may make you feel nauseous or bloated.  Likewise meals heavy in dairy and vegetables can cause extra gas to form and this can also increase the bloating.  Drink plenty of fluids but you should avoid alcoholic beverages for 24 hours.  ACTIVITY: Your care partner should take you home directly after the procedure.  You should plan to take it easy, moving slowly for the rest of the day.  You can resume normal activity the day after the procedure however you should NOT DRIVE or use heavy machinery for 24 hours (because of the sedation medicines used during the test).    SYMPTOMS TO REPORT IMMEDIATELY: A gastroenterologist can be reached at any hour.  During normal business hours, 8:30 AM to 5:00 PM Monday through Friday,  call [phone].  After hours and on weekends, please call the GI answering service at [phone] who will take a message and have the physician on call contact you.   Following lower endoscopy (colonoscopy or flexible sigmoidoscopy):  Excessive amounts of blood in the stool  Significant tenderness or worsening of abdominal pains  Swelling of the abdomen that is new, acute  Fever of 100F or higher  FOLLOW UP: If any biopsies were taken you will be contacted by phone or by letter within the next 1-3 weeks.  Call your gastroenterologist if you have not heard about the biopsies in 3 weeks.  Our staff will call the home number listed on your records the next business day following your procedure to check on you and address any questions or concerns that you may have at that time regarding the information given to you following your procedure. This is a courtesy call and so if there is no answer at the home number and we have not heard from you through the emergency physician on call, we will assume that you have returned to your regular daily activities without incident.  SIGNATURES/CONFIDENTIALITY: You and/or your care partner have signed paperwork which will be entered into your electronic medical record.  These signatures attest to the fact that that the information above on your After Visit Summary has been reviewed and is understood.  Full responsibility of the confidentiality of this   discharge information lies with you and/or your care-partner.  Recommendations See procedure report  

## 2013-04-28 ENCOUNTER — Telehealth: Payer: Self-pay | Admitting: *Deleted

## 2013-04-28 NOTE — Telephone Encounter (Signed)
No answer, message left for the patient. 

## 2013-04-30 ENCOUNTER — Encounter: Payer: Self-pay | Admitting: Gastroenterology

## 2013-05-05 ENCOUNTER — Ambulatory Visit: Payer: No Typology Code available for payment source | Admitting: Family Medicine

## 2013-08-26 ENCOUNTER — Ambulatory Visit: Payer: No Typology Code available for payment source | Admitting: Family Medicine

## 2013-09-11 ENCOUNTER — Other Ambulatory Visit: Payer: Self-pay | Admitting: Obstetrics & Gynecology

## 2014-03-20 ENCOUNTER — Ambulatory Visit (INDEPENDENT_AMBULATORY_CARE_PROVIDER_SITE_OTHER): Payer: No Typology Code available for payment source | Admitting: Family Medicine

## 2014-03-20 ENCOUNTER — Encounter: Payer: Self-pay | Admitting: Family Medicine

## 2014-03-20 VITALS — BP 116/78 | HR 75 | Temp 98.2°F | Ht 65.5 in | Wt 184.1 lb

## 2014-03-20 DIAGNOSIS — N926 Irregular menstruation, unspecified: Secondary | ICD-10-CM

## 2014-03-20 DIAGNOSIS — K5900 Constipation, unspecified: Secondary | ICD-10-CM

## 2014-03-20 DIAGNOSIS — R899 Unspecified abnormal finding in specimens from other organs, systems and tissues: Secondary | ICD-10-CM

## 2014-03-20 LAB — BASIC METABOLIC PANEL
BUN: 11 mg/dL (ref 6–23)
CHLORIDE: 107 meq/L (ref 96–112)
CO2: 21 meq/L (ref 19–32)
CREATININE: 0.9 mg/dL (ref 0.4–1.2)
Calcium: 9 mg/dL (ref 8.4–10.5)
GFR: 93.03 mL/min (ref >60.00)
Glucose, Bld: 86 mg/dL (ref 70–99)
Potassium: 3.9 mEq/L (ref 3.5–5.1)
Sodium: 137 mEq/L (ref 135–145)

## 2014-03-20 NOTE — Patient Instructions (Signed)
BEFORE YOU LEAVE: -labs  -get your flu shot  -follow up yearly or as needed

## 2014-03-20 NOTE — Progress Notes (Signed)
Pre visit review using our clinic review tool, if applicable. No additional management support is needed unless otherwise documented below in the visit note. 

## 2014-03-20 NOTE — Progress Notes (Signed)
  HPI:  Irr menstrual bleeding: -seeing gynecologist -report Dx PCOS and was on metformin for this -seeing Dr. Lynnette Caffey  Constipation/FH colon ca -had her colonoscopy and she is doing well -uses linzess sometimes  Low hgba1c -fasting blood sugar    ROS: See pertinent positives and negatives per HPI.  Past Medical History  Diagnosis Date  . GERD (gastroesophageal reflux disease)   . Menstrual irregularity   . Boil     No past surgical history on file.  Family History  Problem Relation Age of Onset  . Cancer Mother 64    colon cancer  . Hypertension Mother   . Diabetes Maternal Grandmother   . Cancer Maternal Grandfather     colon cancer    History   Social History  . Marital Status: Single    Spouse Name: N/A    Number of Children: N/A  . Years of Education: N/A   Occupational History  . work    Social History Main Topics  . Smoking status: Never Smoker   . Smokeless tobacco: Never Used  . Alcohol Use: Yes     Comment: occ. 2-3 drinks a few times per month  . Drug Use: No  . Sexual Activity: Yes    Birth Control/ Protection: Condom   Other Topics Concern  . 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  GENERAL: vitals reviewed and listed above, alert, oriented, appears well hydrated and in no acute distress  HEENT: atraumatic, conjunttiva clear, no obvious abnormalities on inspection of external nose and ears  NECK: no obvious masses on inspection  LUNGS: clear to  auscultation bilaterally, no wheezes, rales or rhonchi, good air movement  CV: HRRR, no peripheral edema  MS: moves all extremities without noticeable abnormality  PSYCH: pleasant and cooperative, no obvious depression or anxiety  ASSESSMENT AND PLAN:  Discussed the following assessment and plan:  Irregular menstrual bleeding  Abnormal laboratory test - Plan: Basic metabolic panel  Constipation, unspecified constipation type  -stable -checking blood glucose - she has been off metformin for ahwhile and is worried could make her bs too low if restarts -Patient advised to return or notify a doctor immediately if symptoms worsen or persist or new concerns arise.  Patient Instructions  BEFORE YOU LEAVE: -labs  -get your flu shot  -follow up yearly or as needed     Joab Carden R.

## 2015-04-26 ENCOUNTER — Emergency Department (HOSPITAL_COMMUNITY)
Admission: EM | Admit: 2015-04-26 | Discharge: 2015-04-26 | Disposition: A | Discharge status: Home / Self Care | Payer: No Typology Code available for payment source | Attending: Emergency Medicine | Admitting: Emergency Medicine

## 2015-04-26 ENCOUNTER — Encounter (HOSPITAL_COMMUNITY): Payer: Self-pay | Admitting: Emergency Medicine

## 2015-04-26 DIAGNOSIS — Z8719 Personal history of other diseases of the digestive system: Secondary | ICD-10-CM | POA: Insufficient documentation

## 2015-04-26 DIAGNOSIS — Z872 Personal history of diseases of the skin and subcutaneous tissue: Secondary | ICD-10-CM | POA: Insufficient documentation

## 2015-04-26 DIAGNOSIS — Z79899 Other long term (current) drug therapy: Secondary | ICD-10-CM | POA: Insufficient documentation

## 2015-04-26 DIAGNOSIS — R202 Paresthesia of skin: Secondary | ICD-10-CM | POA: Insufficient documentation

## 2015-04-26 DIAGNOSIS — M25512 Pain in left shoulder: Secondary | ICD-10-CM | POA: Insufficient documentation

## 2015-04-26 DIAGNOSIS — Z8742 Personal history of other diseases of the female genital tract: Secondary | ICD-10-CM | POA: Insufficient documentation

## 2015-04-26 MED ORDER — METHOCARBAMOL 500 MG PO TABS: 500.0000 mg | ORAL_TABLET | Freq: Two times a day (BID) | ORAL | Status: DC

## 2015-04-26 MED ORDER — IBUPROFEN 800 MG PO TABS: 800.0000 mg | ORAL_TABLET | Freq: Three times a day (TID) | ORAL | Status: DC

## 2015-04-26 MED ORDER — METHOCARBAMOL 500 MG PO TABS
500.0000 mg | ORAL_TABLET | Freq: Once | ORAL | Status: AC
  Administered 2015-04-26: 500 mg via ORAL
  Filled 2015-04-26: qty 1

## 2015-04-26 MED ORDER — IBUPROFEN 800 MG PO TABS
800.0000 mg | ORAL_TABLET | Freq: Once | ORAL | Status: AC
  Administered 2015-04-26: 800 mg via ORAL
  Filled 2015-04-26: qty 1

## 2015-04-26 NOTE — ED Provider Notes (Signed)
CSN: [id number]     Arrival date & time 04/26/15  [id number] History   First MD Initiated Contact with Patient 04/26/15 1024     Chief Complaint  Patient presents with  . Shoulder Pain    HPI   Caitlyn Buck is a 29 y.o. female with a PMH of GERD who presents to the ED with left posterior shoulder pain, which she states has been present since Friday. She reports her symptoms are constant. She states she has tried tylenol with no significant symptom relief. She reports associated paresthesia to her left upper extremity. She denies numbness, weakness, recent injury, though states she was stretching last Friday prior to the onset of her symptoms.   Past Medical History  Diagnosis Date  . GERD (gastroesophageal reflux disease)   . Menstrual irregularity   . Boil    History reviewed. No pertinent past surgical history. Family History  Problem Relation Age of Onset  . Cancer Mother 84    colon cancer  . Hypertension Mother   . Diabetes Maternal Grandmother   . Cancer Maternal Grandfather     colon cancer   Social History  Substance Use Topics  . Smoking status: Never Smoker   . Smokeless tobacco: Never Used  . Alcohol Use: Yes     Comment: occ. 2-3 drinks a few times per month   OB History    No data available       Review of Systems  Respiratory: Negative for shortness of breath.   Cardiovascular: Negative for chest pain.  Musculoskeletal: Positive for myalgias and arthralgias.  Neurological: Negative for weakness and numbness.      Allergies  Review of patient's allergies indicates no known allergies.  Home Medications   Prior to Admission medications   Medication Sig Start Date End Date Taking? Authorizing Provider  ibuprofen (ADVIL,MOTRIN) 800 MG tablet Take 1 tablet (800 mg total) by mouth 3 (three) times daily. 04/26/15   Marella Chimes, PA-C  Linaclotide [facility]) 145 MCG CAPS capsule Take 1 capsule (145 mcg total) by mouth daily. 04/25/13   Milus Banister, MD  methocarbamol (ROBAXIN) 500 MG tablet Take 1 tablet (500 mg total) by mouth 2 (two) times daily. 04/26/15   Marella Chimes, PA-C    BP 122/68 mmHg  Pulse 85  Temp(Src) 98.2 F (36.8 C) (Oral)  Resp 18  SpO2 100%  LMP 04/03/2015 Physical Exam  Constitutional: She is oriented to person, place, and time. She appears well-developed and well-nourished. No distress.  HENT:  Head: Normocephalic and atraumatic.  Right Ear: External ear normal.  Left Ear: External ear normal.  Nose: Nose normal.  Eyes: Conjunctivae and EOM are normal. Right eye exhibits no discharge. Left eye exhibits no discharge. No scleral icterus.  Neck: Normal range of motion. Neck supple.  Cardiovascular: Normal rate, regular rhythm and intact distal pulses.   Pulmonary/Chest: Effort normal and breath sounds normal. No respiratory distress.  Musculoskeletal: Normal range of motion. She exhibits tenderness. She exhibits no edema.       Left shoulder: She exhibits tenderness. She exhibits normal range of motion, no bony tenderness, no swelling, no effusion, no crepitus, no deformity, no laceration, normal pulse and normal strength.  Mild TTP of left trapezius with palpable spasm. No edema or erythema. Compartments soft. Joints supple.  Neurological: She is alert and oriented to person, place, and time. She has normal strength. No sensory deficit.  Skin: Skin is warm and dry. She is not  diaphoretic.  Psychiatric: She has a normal mood and affect. Her behavior is normal.  Nursing note and vitals reviewed.   ED Course  Procedures (including critical care time)  Labs Review Labs Reviewed - No data to display  Imaging Review No results found.    EKG Interpretation None      MDM   Final diagnoses:  Left shoulder pain    29 year old female presents with left shoulder pain, which she states has been present since Friday. She reports associated paresthesia to her left arm. On exam, patient has  tenderness to her left trapezius with palpable spasm. Strength and sensation intact. Distal pulses intact. No edema or erythema. Compartments soft Joints supple. Full range of motion of left upper extremity. Symptoms likely muscular, do not feel imaging is indicated at this time. Will treat with ibuprofen and robaxin. Patient to follow-up with PCP. Return precautions discussed. Patient verbalizes her understanding and is in agreement with plan.  BP 122/68 mmHg  Pulse 85  Temp(Src) 98.2 F (36.8 C) (Oral)  Resp 18  SpO2 100%  LMP 04/03/2015   Marella Chimes, PA-C 04/26/15 1335  Pattricia Boss, MD 04/28/15 [phone]

## 2015-04-26 NOTE — Discharge Instructions (Signed)
1. Medications: robaxin, ibuprofen, usual home medications 2. Treatment: rest, drink plenty of fluids, ice or heat 3. Follow Up: please followup with your primary doctor for discussion of your diagnoses and further evaluation after today's visit; if you do not have a primary care doctor use the resource guide provided to find one; please return to the ER for increased pain, numbness, weakness, new or worsening symptoms   Shoulder Pain The shoulder is the joint that connects your arm to your body. Muscles and band-like tissues that connect bones to muscles (tendons) hold the joint together. Shoulder pain is felt if an injury or medical problem affects one or more parts of the shoulder. HOME CARE   Put ice on the sore area.  Put ice in a plastic bag.  Place a towel between your skin and the bag.  Leave the ice on for 15-20 minutes, 03-04 times a day for the first 2 days.  Stop using cold packs if they do not help with the pain.  If you were given something to keep your shoulder from moving (sling; shoulder immobilizer), wear it as told. Only take it off to shower or bathe.  Move your arm as little as possible, but keep your hand moving to prevent puffiness (swelling).  Squeeze a soft ball or foam pad as much as possible to help prevent swelling.  Take medicine as told by your doctor. GET HELP IF:  You have progressing new pain in your arm, hand, or fingers.  Your hand or fingers get cold.  Your medicine does not help lessen your pain. GET HELP RIGHT AWAY IF:   Your arm, hand, or fingers are numb or tingling.  Your arm, hand, or fingers are puffy (swollen), painful, or turn [place] or blue. MAKE SURE YOU:   Understand these instructions.  Will watch your condition.  Will get help right away if you are not doing well or get worse.   This information is not intended to replace advice given to you by your health care provider. Make sure you discuss any questions you have with  your health care provider.   Document Released: 10/11/2007 Document Revised: 05/15/2014 Document Reviewed: 08/17/2014 Elsevier Interactive Patient Education 2016 [employer].   Emergency Department Resource Guide 1) Find a Doctor and Pay Out of Pocket Although you won't have to find out who is covered by your insurance plan, it is a good idea to ask around and get recommendations. You will then need to call the office and see if the doctor you have chosen will accept you as a new patient and what types of options they offer for patients who are self-pay. Some doctors offer discounts or will set up payment plans for their patients who do not have insurance, but you will need to ask so you aren't surprised when you get to your appointment.  2) Contact Your Local Health Department Not all health departments have doctors that can see patients for sick visits, but many do, so it is worth a call to see if yours does. If you don't know where your local health department is, you can check in your phone book. The CDC also has a tool to help you locate your state's health department, and many state websites also have listings of all of their local health departments.  3) Find a [facility] If your illness is not likely to be very severe or complicated, you may want to try a walk in clinic. These are popping up  all over the country in pharmacies, drugstores, and shopping centers. They're usually staffed by nurse practitioners or physician assistants that have been trained to treat common illnesses and complaints. They're usually fairly quick and inexpensive. However, if you have serious medical issues or chronic medical problems, these are probably not your best option.  No Primary Care Doctor: - Call Health Connect at  [phone] - they can help you locate a primary care doctor that  accepts your insurance, provides certain services, etc. - Physician Referral Service- [phone]  Chronic Pain  Problems: Organization         Address  Phone   Notes  [facility]  [phone] Patients need to be referred by their primary care doctor.   Medication Assistance: Organization         Address  Phone   Notes  [facility] Medication [facility] [address] [place], [place] [address] [phone] --Must be a resident of [facility] -- Must have NO insurance coverage whatsoever (no Medicaid/ Medicare, etc.) -- The pt. MUST have a primary care doctor that directs their care regularly and follows them in the community   MedAssist  [phone]   [employer]  [phone]    Agencies that provide inexpensive medical care: Organization         Address  Phone   Notes  [place]  [phone]   [facility]    [phone]   [facility] [place], [place] [address] [phone]   [place] [address] [place] [phone]   Planned Parenthood    [phone]   [facility]    [phone]   [place] and [address], [place] Phone:  [phone], Fax:  [phone] Hours of Operation:  9 am - 6 pm, M-F.  Also accepts Medicaid/Medicare and self-pay.  [facility] for [place], [address], [place] Phone: [phone], Fax: [phone]. Hours of Operation:  8:30 am - 5:30 pm, M-F.  Also accepts Medicaid and self-pay.  [facility] [place] [address], [place] Phone: [phone]   [place], [place], [place] [phone], Ext. [phone] Mondays & Thursdays: 7-9 AM.  First 15 patients are seen on a first come, first serve basis.    [place] Providers:  Organization         Address  Phone   Notes  [facility] [address], [place] [phone] Also  accepts self-pay patients.  [facility] [id number] [place], [place]  [phone]   [place], [address], [place] [phone]   [facility] [address] [place] [phone]   Lucianne Lei [address], [place]   [phone] Only accepts [place] Access [place] patients after they have their name applied to their card.   Self-Pay (no insurance) in [facility] [place]:  Organization         Address  Phone   Notes  Sickle Cell Patients, [facility] [place] [phone]   [facility] Urgent Care [place] [phone]   [facility] [place]  [address] [place]  [address], [place] [phone]   [facility]/Dr. Osei-Bonsu  [address] [place] or [address], [place] [phone] Phone number for both [place] and [place] locations is the same.  [facility] [address], [place] [phone]   [facility] [address], [place] or [address] [phone]   [facility] [address] [place] [phone], phone; [phone], fax Sees patients 1st and 3rd Saturday of every month.  Must not qualify for public or private insurance (i.e. Medicaid, Medicare, [place] Health Choice, Veterans' Benefits)  Household income should be no more than 200% of the poverty level The clinic cannot treat you if you are pregnant or think you are pregnant  Sexually transmitted diseases are not treated at the clinic.    Dental Care: Organization         Address  Phone  Notes  [facility] Department of [facility] [phone] Accepts children up to age 59 who are enrolled in [place] or [place]; pregnant  women with a Medicaid card; and children who have applied for Medicaid or [place] Health Choice, but were declined, whose parents can pay a reduced fee at time of service.  [facility] Department of [facility]  [address], [place] [phone] Accepts children up to age 52 who are enrolled in [place] or [place]; pregnant women with a Medicaid card; and children who have applied for Medicaid or [place] Health Choice, but were declined, whose parents can pay a reduced fee at time of service.  [place] Adult Dental Access PROGRAM  [place] [phone] Patients are seen by appointment only. Walk-ins are not accepted. [place] will see patients 68 years of age and older. Monday - Tuesday (8am-5pm) Most Wednesdays (8:30-5pm) $30 per visit, cash only  [facility] Adult Dental Access PROGRAM  [address], [facility] [phone] Patients are seen by appointment only. Walk-ins are not accepted. [place] will see patients 55 years of age and older. One Wednesday Evening (Monthly: Volunteer Based).  $30 per visit, cash only  [place]  [phone] for adults; Children under age 71, call Graduate Pediatric Dentistry at [phone]. Children aged 62-14, please call [phone] to request a pediatric application.  Dental services are provided in all areas of dental care including fillings, crowns and bridges, complete and partial dentures, implants, gum treatment, root canals, and extractions. Preventive care is also provided. Treatment is provided to both adults and children. Patients are selected via a lottery and there is often a waiting list.   [facility] [address] [place]  [phone] [email or web address]   [facility] [address] [place], [place] [phone], Ext. [phone] Second and Fourth Thursday of each month, opens at 6:30 AM; Clinic ends at 9 AM.  Patients are  seen on a first-come first-served basis, and a limited number are seen during each clinic.   [facility]  [address] Hillard Danker [employer], [place] [phone]   Eligibility Requirements You must have lived in [place], [place], or [place] counties for at least the last three months.   You cannot be eligible for state or federal sponsored [employer], including [employer], [place], or [employer].  You generally cannot be eligible for healthcare insurance through your employer.    How to apply: Eligibility screenings are held every Tuesday and Wednesday afternoon from 1:00 pm until 4:00 pm. You do not need an appointment for the interview!  [facility] [address] [place], [place]   [place]  [place] Department  [place]  [phone]    Behavioral Health Resources in the Community: Intensive Outpatient Programs Organization         Address  Phone  Notes  [place]. [address] [place], [place] [phone]   [facility] Outpatient [address] [place], [place]   ADS: Alcohol & Drug Svcs [address], [place], [place]   [place] [address]  [place], [place] or [phone]   Substance Abuse Resources Organization         Address  Phone  Notes  Alcohol and Drug Services  [phone]   [place]  [phone]   [facility] [place]   Chinita Pester  [phone]   Residential & Outpatient Substance Abuse Program  [phone]   Psychological Services Organization         Address  Phone  Notes  [facility] [place]  [place]  [phone]   [place] [address] [place] or [phone]    Mobile Crisis  Teams Organization         Address  Phone  Notes  [facility], Mobile Crisis Care Unit  [phone]   Assertive Psychotherapeutic Services  [address] [place], [place]   [place] Levels [address] [place] [phone]    Self-Help/Support Groups Organization         Address  Phone             Notes  [place]. of [place] - variety of support groups  [place] Call for more information  Narcotics Anonymous (NA), [facility]aring [facility] [address], [employer] [place]  2 meetings at this location   Special educational needs teacher         Address  Phone  Notes  [facility] Residential Treatment [place],    [place]  [phone]   [facility]  [address], [place] [phone], [place], [place]   [place], [place] [phone] Admissions: 8am-3pm M-F  Incentives Substance [place] [address],    [place], [place] [phone]   [facility] [address] [place], [place], [place]   [facility] [address]  [place], [place]   [facility] Programs - Intensive Outpatient [address], [place], [place]   [facility] ([place].,  [place], [place] [phone] or [phone]   [facility]idential Treatment Services (RTS) [address], [place], [place] Accepts Medicaid  [facility] [place] [address],  [place] [phone] Substance Abuse/Addiction Treatment   [facility] Organization         Address  Phone  Notes  [facility]  [phone]   Domenic Schwab, PhD [address] [place], [place]   [phone] or [phone]   [facility]   [phone]  [address] [place], [place] [phone]   [facility] [address] [place], [place] [phone]  Insurance/Medicaid/sponsorship through [facility] Families [address], [place], [place] [phone] [place], [place] [phone]    Dr. Adele Schilder  [phone]   [facility] Dept. 1) [address], [place] 2) [place] 3)  [place] [address], [place] [phone]  [phone]   [place] [phone] or [phone] (After Hours)

## 2015-04-26 NOTE — ED Notes (Signed)
Per pt, states left shoulder blade pain radiating down left arm-no injury

## 2015-05-12 ENCOUNTER — Telehealth: Payer: Self-pay | Admitting: Family Medicine

## 2015-05-12 NOTE — Telephone Encounter (Signed)
Agree with recs to be seen -  UCC or ED if we are full. Unsure of final recs to pt from reading notes.

## 2015-05-12 NOTE — Telephone Encounter (Signed)
Patient Name: Caitlyn Buck  DOB: 1985/12/12    Initial Comment Caller states, wants an appt, has sever pains in back, shooting into Lt arm , weakness in arm    Nurse Assessment  Nurse: Orvan Seen, RN, Jacquilin Date/Time (Eastern Time): 05/12/2015 8:41:16 AM  Confirm and document reason for call. If symptomatic, describe symptoms. ---Caller states, wants an appt, has sever pains in back, shooting into Lt arm , weakness in arm  Has the patient traveled out of the country within the last 30 days? ---No  Does the patient have any new or worsening symptoms? ---Yes  Will a triage be completed? ---Yes  Related visit to physician within the last 2 weeks? ---No  Does the PT have any chronic conditions? (i.e. diabetes, asthma, etc.) ---No  Did the patient indicate they were pregnant? ---No  Is this a behavioral health or substance abuse call? ---No     Guidelines    Guideline Title Affirmed Question Affirmed Notes  Back Pain [1] SEVERE back pain (e.g., excruciating, unable to do any normal activities) AND [2] not improved 2 hours after pain medicine    Final Disposition User   See Physician within 4 Hours (or PCP triage) Orvan Seen, RN, Jacquilin    Comments  No appointments open at either PCP office or [place]- Patient advised to be seen at [facility] or ER   Referrals  [facility] - ED   Disagree/Comply: Comply

## 2015-05-12 NOTE — Telephone Encounter (Signed)
Call pt and left a voicemail

## 2015-05-12 NOTE — Telephone Encounter (Signed)
Please advise 

## 2016-03-02 ENCOUNTER — Encounter: Payer: Self-pay | Admitting: Family Medicine

## 2016-03-02 ENCOUNTER — Ambulatory Visit (INDEPENDENT_AMBULATORY_CARE_PROVIDER_SITE_OTHER): Payer: BLUE CROSS/BLUE SHIELD | Admitting: Family Medicine

## 2016-03-02 VITALS — BP 100/70 | HR 71 | Temp 98.6°F | Ht 66.0 in | Wt 183.4 lb

## 2016-03-02 DIAGNOSIS — K5900 Constipation, unspecified: Secondary | ICD-10-CM

## 2016-03-02 DIAGNOSIS — N926 Irregular menstruation, unspecified: Secondary | ICD-10-CM

## 2016-03-02 DIAGNOSIS — Z6829 Body mass index (BMI) 29.0-29.9, adult: Secondary | ICD-10-CM

## 2016-03-02 DIAGNOSIS — Z0000 Encounter for general adult medical examination without abnormal findings: Secondary | ICD-10-CM | POA: Diagnosis not present

## 2016-03-02 DIAGNOSIS — R899 Unspecified abnormal finding in specimens from other organs, systems and tissues: Secondary | ICD-10-CM

## 2016-03-02 LAB — POCT URINE PREGNANCY: Preg Test, Ur: NEGATIVE

## 2016-03-02 MED ORDER — LINACLOTIDE 145 MCG PO CAPS: 145.0000 ug | ORAL_CAPSULE | Freq: Every day | ORAL | 11 refills | Status: DC

## 2016-03-02 NOTE — Patient Instructions (Addendum)
BEFORE YOU LEAVE: -labs -follow up: yearly and as needed  Please do your pap smear and breast exam with your gynecologist.  Take Vit D3 [phone] daily  Use fiber supplement such as metameucil every morning before break fast and mirilax daily for 5-7 days at the first signs of constipation. If this, along with a healthy diet, is not working then could try the linzess again.   We recommend the following healthy lifestyle for LIFE: 1) Small portions.   Tip: eat off of a salad plate instead of a dinner plate.  Tip: It is ok to feel hungry after a meal if you ate a normal portion  Tip: if you need more or a snack choose fruits, veggies and/or a handful of nuts or seeds.  2) Eat a healthy clean diet.  * Tip: Avoid (less then 1 serving per week): processed foods, sweets, sweetened drinks, [place] starches (rice, flour, bread, potatoes, pasta, etc), red meat, fast foods, butter  *Tip: CHOOSE instead   * 5-9 servings per day of fresh or frozen fruits and vegetables (but not corn, potatoes, bananas, canned or dried fruit)   *nuts and seeds, beans   *olives and olive oil   *small portions of lean meats such as fish and [place] chicken    *small portions of whole grains  3)Get at least 150 minutes of sweaty aerobic exercise per week.  4)Reduce stress - consider counseling, meditation and relaxation to balance other aspects of your life.

## 2016-03-02 NOTE — Progress Notes (Signed)
HPI:  Here for CPE: Not seen here since 2015.  -Concerns and/or follow up today: none  -Diet: reports diet is bad  -Exercise: no regular exercise  -Taking folic acid, vitamin D or calcium: no  -Diabetes and Dyslipidemia Screening: fasting for labs today  -Vaccines: refuses  -pap history: 07/2010 in chart - normal; reports she sees gynecologist and had normal pap in 2015 and is scheduled to see gyn in December of this year - has PCOS with irr periods and sees gyn for this  -FDLMP: sept 10 2017 - this is typical for her to have missed periods but she wants to check upreg as has been trying to conceive  -sexual activity: yes, female partner, no new partners  -wants STI testing (Hep C if born [birth year]): wants HIV and RPR with labs when offered; declined other and no symptoms  -FH breast, colon or ovarian ca: see FH Last mammogram: n/a Last colon cancer screening: has had colonoscopy for constipation, FH colon cancer   -Alcohol, Tobacco, drug use: see social history  Review of Systems - no fevers, unintentional weight loss, vision loss, hearing loss, chest pain, sob, hemoptysis, melena, hematochezia, hematuria, genital discharge, changing or concerning skin lesions, bleeding, bruising, loc, thoughts of self harm or SI  Past Medical History:  Diagnosis Date  . Boil   . GERD (gastroesophageal reflux disease)   . Menstrual irregularity     No past surgical history on file.  Family History  Problem Relation Age of Onset  . Cancer Mother 72    colon cancer  . Hypertension Mother   . Diabetes Maternal Grandmother   . Cancer Maternal Grandfather     colon cancer    Social History   Social History  . Marital status: Single    Spouse name: N/A  . Number of children: N/A  . Years of education: N/A   Occupational History  . work [employer]   Social History Main Topics  . Smoking status: Never Smoker  . Smokeless tobacco: Never Used  . Alcohol use Yes     Comment: occ. 2-3  drinks a few times per month  . Drug use: No  . Sexual activity: Yes    Birth control/ protection: Condom   Other Topics Concern  . 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  ibuprofen (ADVIL,MOTRIN) 800 MG tablet, Take 1 tablet (800 mg total) by mouth 3 (three) times daily., Disp: 21 tablet, Rfl: 0 .  linaclotide (LINZESS) 145 MCG CAPS capsule, Take 1 capsule (145 mcg total) by mouth daily., Disp: 30 capsule, Rfl: 11  EXAM:  Vitals:   03/02/16 1309  BP: 100/70  Pulse: 71  Temp: 98.6 F (37 C)   Body mass index is 29.6 kg/m.  GENERAL: vitals reviewed and listed below, alert, oriented, appears well hydrated and in no acute distress  HEENT: head atraumatic, PERRLA, normal appearance of eyes, ears, nose and mouth. moist mucus membranes.  NECK: supple, no masses or lymphadenopathy  LUNGS: clear to auscultation bilaterally, no rales, rhonchi or wheeze  CV: HRRR, no peripheral edema or cyanosis, normal pedal pulses  BREAST:  does with gyn  ABDOMEN: bowel sounds normal, soft, non tender to palpation, no masses, no rebound or guarding  GU: does with gyn and reports scheduled for december  SKIN: no rash or abnormal lesions  MS: normal gait, moves all extremities normally  NEURO: normal gait, speech and thought processing grossly intact, muscle tone grossly intact throughout  PSYCH: normal affect, pleasant and cooperative  ASSESSMENT AND PLAN:  Discussed the following assessment and plan:  Encounter for preventive health examination - Plan: Basic metabolic panel, CBC (no diff), TSH, Lipid Panel, HIV antibody (with reflex), RPR  Constipation, unspecified constipation type  BMI 29.0-29.9,adult  Irregular menses - Plan: POCT  urine pregnancy   -Discussed and advised all [place] preventive services health task force level A and B recommendations for age, sex and risks.  -Advised at least 150 minutes of exercise per week and a healthy diet with avoidance of (less then 1 serving per week) processed foods, [place] starches, red meat, fast foods and sweets and consisting of: * 5-9 servings of fresh fruits and vegetables (not corn or potatoes) *nuts and seeds, beans *olives and olive oil *lean meats such as fish and [place] chicken  *whole grains  -upreg first test with very faint area of pink but no line - repeat negative. Told patient and she agreed to repeat test at home in 2 weeks or follow up. Take PNV, avoid toxins and meds since trying to conceive.  -FASTING labs, studies and vaccines per orders this encounter  Orders Placed This Encounter  Procedures  . Basic metabolic panel  . CBC (no diff)  . TSH  . Lipid Panel  . HIV antibody (with reflex)  . RPR  . POCT urine pregnancy    Patient advised to return to clinic immediately if symptoms worsen or persist or new concerns.  Patient Instructions  BEFORE YOU LEAVE: -labs -follow up: yearly and as needed  Please do your pap smear and breast exam with your gynecologist.  Take Vit D3 [phone] IU daily  Use fiber supplement such as metameucil every morning before break fast and mirilax daily for 5-7 days at the first signs of constipation. If this, along with a healthy diet, is not working then could try the linzess again.   We recommend the following healthy lifestyle for LIFE: 1) Small portions.   Tip: eat off of a salad plate instead of a dinner plate.  Tip: It is ok to feel hungry after a meal if you ate a normal portion  Tip: if you need more or a snack choose fruits, veggies and/or a handful of nuts or seeds.  2) Eat a healthy clean diet.  * Tip: Avoid (less then 1 serving per week): processed foods, sweets, sweetened drinks, [place] starches (rice,  flour, bread, potatoes, pasta, etc), red meat, fast foods, butter  *Tip: CHOOSE instead   * 5-9 servings per day of fresh or frozen fruits and vegetables (but not corn, potatoes, bananas, canned or dried fruit)   *nuts and seeds, beans   *olives and olive oil   *small portions of lean meats such as fish and [place] chicken    *small portions of whole grains  3)Get at least 150 minutes of sweaty aerobic exercise per week.  4)Reduce stress - consider counseling, meditation and relaxation to balance other aspects of your life.     No Follow-up on file.  Colin Benton R., DO

## 2016-03-02 NOTE — Progress Notes (Signed)
Pre visit review using our clinic review tool, if applicable. No additional management support is needed unless otherwise documented below in the visit note. 

## 2016-03-03 LAB — BASIC METABOLIC PANEL
BUN: 12 mg/dL (ref 6–23)
CALCIUM: 9.6 mg/dL (ref 8.4–10.5)
CHLORIDE: 105 meq/L (ref 96–112)
CO2: 27 meq/L (ref 19–32)
CREATININE: 0.88 mg/dL (ref 0.40–1.20)
GFR: 97.76 mL/min (ref >60.00)
Glucose, Bld: 74 mg/dL (ref 70–99)
Potassium: 4.3 mEq/L (ref 3.5–5.1)
Sodium: 139 mEq/L (ref 135–145)

## 2016-03-03 LAB — LIPID PANEL
CHOLESTEROL: 154 mg/dL (ref 0–200)
HDL: 49 mg/dL (ref >39.00)
LDL Cholesterol: 94 mg/dL (ref 0–99)
NONHDL: 105.29
Total CHOL/HDL Ratio: 3
Triglycerides: 57 mg/dL (ref 0.0–149.0)
VLDL: 11.4 mg/dL (ref 0.0–40.0)

## 2016-03-03 LAB — HIV ANTIBODY (ROUTINE TESTING W REFLEX): HIV 1&2 Ab, 4th Generation: NONREACTIVE

## 2016-03-03 LAB — CBC
HEMATOCRIT: 42.5 % (ref 36.0–46.0)
Hemoglobin: 14.7 g/dL (ref 12.0–15.0)
MCHC: 34.7 g/dL (ref 30.0–36.0)
MCV: 98.5 fl (ref 78.0–100.0)
Platelets: 279 10*3/uL (ref 150.0–400.0)
RBC: 4.31 Mil/uL (ref 3.87–5.11)
RDW: 13.5 % (ref 11.5–15.5)
WBC: 13.3 10*3/uL — ABNORMAL HIGH (ref 4.0–10.5)

## 2016-03-03 LAB — RPR

## 2016-03-06 LAB — TSH: TSH: 0.31 u[IU]/mL — AB (ref 0.35–4.50)

## 2016-03-09 ENCOUNTER — Telehealth: Payer: Self-pay | Admitting: Family Medicine

## 2016-03-09 NOTE — Telephone Encounter (Signed)
Caitlyn Buck,  Please see result. It looks like you already talked with her? Can you check why we keep getting a note about this - may need to call her to see if she has further questions?

## 2016-03-09 NOTE — Telephone Encounter (Signed)
See results note. 

## 2016-03-09 NOTE — Telephone Encounter (Signed)
Pt needs blood work result °

## 2016-03-09 NOTE — Telephone Encounter (Signed)
° ° °  Pt call and would like a call back concerning her lab results. She has questions about Bolt blood cell being high would like to know what that mean .

## 2016-03-09 NOTE — Telephone Encounter (Signed)
I called the Caitlyn Buck back and informed her per Dr Maudie Mercury this is very common lab finding that she sees and it could be an error with the labs, could be an infection or inflammation in the body and she always likes to repeat the test to be sure and the Caitlyn Buck agreed.

## 2016-03-09 NOTE — Addendum Note (Signed)
Addended by: Agnes Lawrence on: 03/09/2016 10:33 AM   Modules accepted: Orders

## 2016-04-10 ENCOUNTER — Other Ambulatory Visit (INDEPENDENT_AMBULATORY_CARE_PROVIDER_SITE_OTHER): Payer: BLUE CROSS/BLUE SHIELD

## 2016-04-10 DIAGNOSIS — R899 Unspecified abnormal finding in specimens from other organs, systems and tissues: Secondary | ICD-10-CM | POA: Diagnosis not present

## 2016-04-10 LAB — CBC WITH DIFFERENTIAL/PLATELET
Basophils Absolute: 0 10*3/uL (ref 0.0–0.1)
Basophils Relative: 0.3 % (ref 0.0–3.0)
EOS PCT: 1.5 % (ref 0.0–5.0)
Eosinophils Absolute: 0.1 10*3/uL (ref 0.0–0.7)
HCT: 42.8 % (ref 36.0–46.0)
Hemoglobin: 14.7 g/dL (ref 12.0–15.0)
LYMPHS ABS: 1.8 10*3/uL (ref 0.7–4.0)
Lymphocytes Relative: 19.5 % (ref 12.0–46.0)
MCHC: 34.4 g/dL (ref 30.0–36.0)
MCV: 98.7 fl (ref 78.0–100.0)
MONO ABS: 0.6 10*3/uL (ref 0.1–1.0)
MONOS PCT: 6 % (ref 3.0–12.0)
NEUTROS ABS: 6.8 10*3/uL (ref 1.4–7.7)
NEUTROS PCT: 72.7 % (ref 43.0–77.0)
PLATELETS: 255 10*3/uL (ref 150.0–400.0)
RBC: 4.34 Mil/uL (ref 3.87–5.11)
RDW: 13.7 % (ref 11.5–15.5)
WBC: 9.3 10*3/uL (ref 4.0–10.5)

## 2016-04-10 LAB — T4, FREE: FREE T4: 0.67 ng/dL (ref 0.60–1.60)

## 2016-04-10 LAB — TSH: TSH: 0.65 u[IU]/mL (ref 0.35–4.50)

## 2016-04-19 ENCOUNTER — Telehealth: Payer: Self-pay | Admitting: Family Medicine

## 2016-04-19 NOTE — Telephone Encounter (Signed)
Pt request refill  ibuprofen (ADVIL,MOTRIN) 800 MG tablet  Pt states she is on her menstrual and needs asap.  [facility] [address]

## 2016-04-20 MED ORDER — IBUPROFEN 800 MG PO TABS: 800.0000 mg | ORAL_TABLET | Freq: Three times a day (TID) | ORAL | 0 refills | Status: DC

## 2016-04-20 NOTE — Telephone Encounter (Signed)
Rx done and I called the pt and informed her of this. 

## 2016-04-20 NOTE — Telephone Encounter (Signed)
Ok to refill 

## 2016-05-22 ENCOUNTER — Other Ambulatory Visit: Payer: Self-pay | Admitting: Adult Health

## 2016-11-03 ENCOUNTER — Ambulatory Visit: Payer: BLUE CROSS/BLUE SHIELD | Admitting: Family Medicine

## 2017-06-15 DIAGNOSIS — N979 Female infertility, unspecified: Secondary | ICD-10-CM | POA: Diagnosis not present

## 2017-07-03 DIAGNOSIS — N979 Female infertility, unspecified: Secondary | ICD-10-CM | POA: Diagnosis not present

## 2018-08-06 ENCOUNTER — Encounter: Payer: BLUE CROSS/BLUE SHIELD | Admitting: Family Medicine

## 2018-10-01 DIAGNOSIS — Z01419 Encounter for gynecological examination (general) (routine) without abnormal findings: Secondary | ICD-10-CM | POA: Diagnosis not present

## 2018-10-01 DIAGNOSIS — Z6831 Body mass index (BMI) 31.0-31.9, adult: Secondary | ICD-10-CM | POA: Diagnosis not present

## 2018-10-01 DIAGNOSIS — E282 Polycystic ovarian syndrome: Secondary | ICD-10-CM | POA: Insufficient documentation

## 2018-10-24 DIAGNOSIS — Z20828 Contact with and (suspected) exposure to other viral communicable diseases: Secondary | ICD-10-CM | POA: Diagnosis not present

## 2018-10-29 ENCOUNTER — Encounter: Payer: Self-pay | Admitting: Family Medicine

## 2018-10-29 ENCOUNTER — Ambulatory Visit (INDEPENDENT_AMBULATORY_CARE_PROVIDER_SITE_OTHER): Payer: BLUE CROSS/BLUE SHIELD | Admitting: Family Medicine

## 2018-10-29 ENCOUNTER — Other Ambulatory Visit: Payer: Self-pay

## 2018-10-29 DIAGNOSIS — Z20828 Contact with and (suspected) exposure to other viral communicable diseases: Secondary | ICD-10-CM

## 2018-10-29 DIAGNOSIS — Z20822 Contact with and (suspected) exposure to covid-19: Secondary | ICD-10-CM

## 2018-10-29 NOTE — Patient Instructions (Addendum)
Per CDC guidelines, we recommend 14 days of home quarantine from the time of known exposure, if at all possible - nonessential worker,  in a situation of potential exposure to [place]. We also recommend that any workers whom can work from home do so for 14 days, and throughout the pandemic if possible if they are high risk or they have high risks household contacts, to decrease the chance of exposure or spread.  Please follow CDC guidelines and contact your local health department if you or your employer have further questions.

## 2018-10-29 NOTE — Progress Notes (Signed)
Virtual Visit via Video Note  I connected with Caitlyn Buck  on 10/29/18 at 12:40 PM EDT by a video enabled telemedicine application and verified that I am speaking with the correct person using two identifiers.  Location patient: home Location provider:work or home office Persons participating in the virtual visit: patient, provider  I discussed the limitations of evaluation and management by telemedicine and the availability of in person appointments. The patient expressed understanding and agreed to proceed.   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 -now there have been 3 other positive cases in clients and they are all on the hall where her office is located -they required everyone to get tested and she was negative -not all of the test results for patients and staff have come back yet -she denies CP, SOB, DOE, sore throat, cough, NVD, fevers -nobody at work has symptoms, they are all being screened with temperature checks and symptoms screening -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  Past Medical History:  Diagnosis Date  . Boil   . GERD (gastroesophageal reflux disease)   . Menstrual irregularity     History reviewed. No pertinent surgical history.  Family History  Problem Relation Age of Onset  . Cancer Mother 49       colon cancer  . Hypertension Mother   . Diabetes Maternal Grandmother   . Cancer Maternal Grandfather        colon cancer    SOCIAL  HX: see hpi   Current Outpatient Medications:  .  ibuprofen (ADVIL,MOTRIN) 800 MG tablet, TAKE 1 TABLET (800 MG TOTAL) BY MOUTH 3 (THREE) TIMES DAILY. (Patient taking differently: Take 800 mg by mouth as needed. ), Disp: 90 tablet, Rfl: 0  EXAM:  VITALS per patient if applicable:denies fever  GENERAL: alert, oriented, appears well and in no acute distress  HEENT: atraumatic, conjunttiva clear, no obvious abnormalities on inspection of external nose and ears  NECK: normal movements of the head and neck  LUNGS: on inspection no signs of respiratory distress, breathing rate appears normal, no obvious gross SOB, gasping or wheezing  CV: no obvious cyanosis  MS: moves all visible extremities without noticeable abnormality  PSYCH/NEURO: pleasant and cooperative, no obvious depression or anxiety, speech and thought processing grossly intact  ASSESSMENT AND PLAN:  Discussed the following assessment and plan:  Exposure to Covid-19 Virus -  Discussed CDC guidelines. She is worried about potential reexposures since she was tested since multiple others are now testing positive and testing of everyone at work has not been completed. Advised following CDC guidelines and that she work from home if possible for 14 days from the exposure and use masks, social distancing, hand hygeine and caution. She is going to try to find another family member to care for her grandmother. Discussed hygeine, masking, prevention. Note with recs for work in the patient instructions. Follow up if any symptoms.    I discussed the  assessment and treatment plan with the patient. The patient was provided an opportunity to ask questions and all were answered. The patient agreed with the plan and demonstrated an understanding of the instructions.   The patient was advised to call back or seek an in-person evaluation if the symptoms worsen or if the condition fails to improve as anticipated.   [name], DO

## 2018-10-30 ENCOUNTER — Telehealth: Payer: Self-pay | Admitting: *Deleted

## 2018-10-30 ENCOUNTER — Encounter: Payer: Self-pay | Admitting: Family Medicine

## 2018-10-30 NOTE — Telephone Encounter (Signed)
I called the pt and left a detailed message a letter was completed with the information per below.  Alos stated the note should be in her chart and I will also mail this to the home address.

## 2018-10-30 NOTE — Telephone Encounter (Signed)
I believe I wrote the work instructions on her patient instructions. You can copy and past that into a work note if she prefers. Thanks!

## 2018-10-30 NOTE — Telephone Encounter (Signed)
Copied from [place] [phone]. Topic: General - Other >> Oct 29, 2018  1:47 PM Mcneil, Ja-Kwan wrote: Reason for CRM: Pt stated Dr. Maudie Mercury informed her that a doctor's note would show on her [facility] but there is no note listed. Pt requests to come in to the office to pick up the doctor's note or have it sent to her e-mail address that is listed. Pt requests a call back. Cb# [phone] >> Oct 29, 2018  3:47 PM Selinda Flavin B, NT wrote: Patient calling to check status of work note. States that she has not received anything in MyChart yet. Would like a call once the note has been sent. Please advise.  CB#: [phone]

## 2019-05-04 DIAGNOSIS — Z03818 Encounter for observation for suspected exposure to other biological agents ruled out: Secondary | ICD-10-CM | POA: Diagnosis not present

## 2019-05-04 DIAGNOSIS — Z20828 Contact with and (suspected) exposure to other viral communicable diseases: Secondary | ICD-10-CM | POA: Diagnosis not present

## 2019-05-13 DIAGNOSIS — Z03818 Encounter for observation for suspected exposure to other biological agents ruled out: Secondary | ICD-10-CM | POA: Diagnosis not present

## 2019-05-13 DIAGNOSIS — U071 COVID-19: Secondary | ICD-10-CM | POA: Diagnosis not present

## 2019-05-17 DIAGNOSIS — U071 COVID-19: Secondary | ICD-10-CM | POA: Diagnosis not present

## 2019-05-17 DIAGNOSIS — Z9189 Other specified personal risk factors, not elsewhere classified: Secondary | ICD-10-CM | POA: Diagnosis not present

## 2019-05-22 ENCOUNTER — Other Ambulatory Visit: Payer: BLUE CROSS/BLUE SHIELD

## 2019-08-14 ENCOUNTER — Encounter: Payer: Self-pay | Admitting: Family Medicine

## 2019-08-14 ENCOUNTER — Telehealth (INDEPENDENT_AMBULATORY_CARE_PROVIDER_SITE_OTHER): Payer: BLUE CROSS/BLUE SHIELD | Admitting: Family Medicine

## 2019-08-14 VITALS — Temp 97.2°F | Wt 180.0 lb

## 2019-08-14 DIAGNOSIS — M546 Pain in thoracic spine: Secondary | ICD-10-CM | POA: Diagnosis not present

## 2019-08-14 DIAGNOSIS — M542 Cervicalgia: Secondary | ICD-10-CM

## 2019-08-14 DIAGNOSIS — M79602 Pain in left arm: Secondary | ICD-10-CM

## 2019-08-14 DIAGNOSIS — Z8739 Personal history of other diseases of the musculoskeletal system and connective tissue: Secondary | ICD-10-CM

## 2019-08-14 MED ORDER — PREDNISONE 10 MG PO TABS: ORAL_TABLET | ORAL | 0 refills | Status: DC

## 2019-08-14 MED ORDER — TIZANIDINE HCL 2 MG PO CAPS: 2.0000 mg | ORAL_CAPSULE | Freq: Two times a day (BID) | ORAL | 0 refills | Status: DC | PRN

## 2019-08-14 NOTE — Progress Notes (Signed)
Virtual Visit via Video Note  I connected with Caitlyn Buck  on 08/14/19 at 10:00 AM EDT by a video enabled telemedicine application and verified that I am speaking with the correct person using two identifiers.  Location patient: home, [place] Location provider:work or home office Persons participating in the virtual visit: patient, provider  I discussed the limitations of evaluation and management by telemedicine and the availability of in person appointments. The patient expressed understanding and agreed to proceed.   HPI:  Acute visit for Neck Pain/upper back pain/arm/shoulder pain: -this is a chronic issue for her -she saw someone at [facility] for this in the past, they did an MRI and told her she had a slipped disk with a pinched nerve -she saw a neurosurgeon -was doing well until slept wrong and this flared up again starting last week -pain is a sharp/toothache type of pain in the L neck radiating to the L shoulder/arm to elbow -denies: weakness, numbness, fever, malaise -this feels the  -she has tried [facility] powders, ibuprofen, tylenol, biofreeze, icy hot - helps for a short time   ROS: See pertinent positives and negatives per HPI.  Past Medical History:  Diagnosis Date  . Boil   . GERD (gastroesophageal reflux disease)   . Menstrual irregularity     History reviewed. No pertinent surgical history.  Family History  Problem Relation Age of Onset  . Cancer Mother 41       colon cancer  . Hypertension Mother   . Diabetes Maternal Grandmother   . Cancer Maternal Grandfather        colon cancer    SOCIAL HX: see hpi   Current Outpatient Medications:  .  ibuprofen (ADVIL,MOTRIN) 800 MG tablet, TAKE 1 TABLET (800 MG TOTAL) BY MOUTH 3 (THREE) TIMES DAILY. (Patient taking differently: Take 800 mg by mouth as needed. ), Disp: 90 tablet, Rfl: 0 .  predniSONE (DELTASONE) 10 MG tablet, 50mg  (5 tabs) x 2 days, then 30mg  (3 tabs) x 2 days, then 20mg  (2 tabs) x 3 days, then 10mg  (1  tab) x 3 days, Disp: 25 tablet, Rfl: 0 .  tizanidine (ZANAFLEX) 2 MG capsule, Take 1 capsule (2 mg total) by mouth 2 (two) times daily as needed for muscle spasms., Disp: 20 capsule, Rfl: 0  EXAM:  VITALS per patient if applicable: denies fever  GENERAL: alert, oriented, appears well and in no acute distress  HEENT: atraumatic, conjunttiva clear, no obvious abnormalities on inspection of external nose and ears  NECK: normal movements of the head and neck  LUNGS: on inspection no signs of respiratory distress, breathing rate appears normal, no obvious gross SOB, gasping or wheezing  CV: no obvious cyanosis  MS: moves all visible extremities without noticeable abnormality, ROM in head and neck preserved in rotation, side bending and ext/flex, ROM in shoulders preserved, on patient self exam she reports TTP in the paraspinal tissues in the upper thoracic region on the L   PSYCH/NEURO: pleasant and cooperative, no obvious depression or anxiety, speech and thought processing grossly intact  ASSESSMENT AND PLAN:  Discussed the following assessment and plan:  Neck pain  Acute left-sided thoracic back pain  Pain of left upper extremity  History of intervertebral disc disorder  -we discussed possible serious and likely etiologies, options for evaluation and workup, limitations of telemedicine visit vs in person visit, treatment, treatment risks and precautions. Pt prefers to treat via telemedicine empirically rather then risking or undertaking an in person visit at this  moment. Discussed potential etiologies of her symptoms and possible radicular component given history. Exam is limited given video visit, however her ROM seems to be pretty good. She opted to try short prednisone taper, topical menthol and tylenol prn for pain and muscle relaxer along with gentle activity acutely. She agrees to follow up with [facility] if any worsening or does not resolve with this treatment and also set  up visit to have in house PCP and do CPE in our office. Message sent to schedulers to assise. Patient agrees to seek prompt in person care if worsening, new symptoms arise, or if is not improving with treatment.   I discussed the assessment and treatment plan with the patient. The patient was provided an opportunity to ask questions and all were answered. The patient agreed with the plan and demonstrated an understanding of the instructions.   The patient was advised to call back or seek an in-person evaluation if the symptoms worsen or if the condition fails to improve as anticipated.   Caitlyn Kern, DO

## 2019-08-14 NOTE — Patient Instructions (Signed)
-  I sent the medication(s) we discussed to your pharmacy: Meds ordered this encounter  Medications  . predniSONE (DELTASONE) 10 MG tablet    Sig: 50mg  (5 tabs) x 2 days, then 30mg  (3 tabs) x 2 days, then 20mg  (2 tabs) x 3 days, then 10mg  (1 tab) x 3 days    Dispense:  25 tablet    Refill:  0  . tizanidine (ZANAFLEX) 2 MG capsule    Sig: Take 1 capsule (2 mg total) by mouth 2 (two) times daily as needed for muscle spasms.    Dispense:  20 capsule    Refill:  0    Please let us know if you have any questions or concerns regarding this prescription.  Follow up in our office in 3-4 weeks. Seek care in person (would suggest [facility] since you said they have your imaging and records) if any worsening or if symptoms do not resolve with the treatment.  I hope you are feeling better soon!

## 2020-01-27 ENCOUNTER — Other Ambulatory Visit (HOSPITAL_COMMUNITY)
Admission: RE | Admit: 2020-01-27 | Discharge: 2020-01-27 | Disposition: A | Discharge status: Home / Self Care | Payer: BLUE CROSS/BLUE SHIELD | Source: Ambulatory Visit | Attending: Family Medicine | Admitting: Family Medicine

## 2020-01-27 ENCOUNTER — Other Ambulatory Visit: Payer: Self-pay | Admitting: Family Medicine

## 2020-01-27 DIAGNOSIS — Z0000 Encounter for general adult medical examination without abnormal findings: Secondary | ICD-10-CM | POA: Insufficient documentation

## 2020-01-30 LAB — CYTOLOGY - PAP
Comment: NEGATIVE
Diagnosis: NEGATIVE
High risk HPV: NEGATIVE

## 2020-02-07 ENCOUNTER — Emergency Department (HOSPITAL_COMMUNITY): Payer: BC Managed Care – PPO

## 2020-02-07 ENCOUNTER — Other Ambulatory Visit: Payer: Self-pay

## 2020-02-07 ENCOUNTER — Encounter (HOSPITAL_COMMUNITY): Payer: Self-pay | Admitting: Emergency Medicine

## 2020-02-07 ENCOUNTER — Emergency Department (HOSPITAL_COMMUNITY)
Admission: EM | Admit: 2020-02-07 | Discharge: 2020-02-08 | Disposition: A | Discharge status: Home / Self Care | Payer: BC Managed Care – PPO | Attending: Physician Assistant | Admitting: Physician Assistant

## 2020-02-07 ENCOUNTER — Ambulatory Visit (HOSPITAL_COMMUNITY)
Admission: EM | Admit: 2020-02-07 | Discharge: 2020-02-07 | Disposition: A | Discharge status: Nursing Home (Includes ICF) | Payer: Self-pay

## 2020-02-07 DIAGNOSIS — R519 Headache, unspecified: Secondary | ICD-10-CM | POA: Insufficient documentation

## 2020-02-07 DIAGNOSIS — Z5321 Procedure and treatment not carried out due to patient leaving prior to being seen by health care provider: Secondary | ICD-10-CM | POA: Insufficient documentation

## 2020-02-07 NOTE — ED Notes (Signed)
Pt now with vomiting.

## 2020-02-07 NOTE — ED Triage Notes (Signed)
Per multiple family members and pt, pt c/o sudden onset worst HA of her life approx 2 hrs ago.  States she seems she isn't able to answer some questions appropriately.  Pt alert, but in wheelchair holding her head. Discussed with Kirkland Hun, NP -- pt to go to ED immediately.  Family verbalized understanding.

## 2020-02-07 NOTE — ED Triage Notes (Signed)
Pt reports headache that started earlier today.  Denies history of headache.  No arm drift or other neuro deficits noted on triage exam.  Pt sent to ED from [facility] for "worst HA of her life".  Pt in wheelchair holding head.

## 2020-02-07 NOTE — ED Notes (Signed)
PA notified of pt and order for head CT received.

## 2020-02-08 NOTE — ED Notes (Signed)
Pt stated she was leaving, did not want to wait any longer.

## 2021-04-11 ENCOUNTER — Other Ambulatory Visit: Payer: Self-pay

## 2021-04-13 ENCOUNTER — Encounter: Payer: Self-pay | Admitting: Nurse Practitioner

## 2021-04-13 ENCOUNTER — Ambulatory Visit (INDEPENDENT_AMBULATORY_CARE_PROVIDER_SITE_OTHER): Payer: BC Managed Care – PPO | Admitting: Nurse Practitioner

## 2021-04-13 VITALS — BP 120/78 | HR 72 | Ht 66.0 in | Wt 180.0 lb

## 2021-04-13 DIAGNOSIS — K625 Hemorrhage of anus and rectum: Secondary | ICD-10-CM

## 2021-04-13 DIAGNOSIS — Z800 Family history of malignant neoplasm of digestive organs: Secondary | ICD-10-CM | POA: Diagnosis not present

## 2021-04-13 DIAGNOSIS — K5900 Constipation, unspecified: Secondary | ICD-10-CM

## 2021-04-13 MED ORDER — SUPREP BOWEL PREP KIT 17.5-3.13-1.6 GM/177ML PO SOLN: 1.0000 | ORAL | 0 refills | Status: DC

## 2021-04-13 NOTE — Progress Notes (Signed)
   04/13/2021 Caitlyn Buck [id number] 06/09/1985   CHIEF COMPLAINT: Rectal bleeding  HISTORY OF PRESENT ILLNESS:  Caitlyn Buck is a 35 year old female with a history of constipation.  No past surgical history.  She was initially seen by Dr. Jacobs in 2014 due to having constipation. She underwent a colonoscopy 04/25/2013 which identified a 3 mm polyp which was removed from the ascending colon.  Pathology report showed benign polypoid colonic mucosa and she was advised to repeat a colonoscopy 03/2022 at the age of 36 which is 10 years prior to her mother's age of colon cancer diagnosis.  She presents to our office today for further evaluation regarding rectal bleeding.  She reports passing a small amount of bright red blood on the tissue and on the stool which occurs if she strains to pass a bowel movement which is occurred 3 times over the past year.  The last episode of rectal bleeding occurred 03/24/2021 after she was constipated then passed a large "blowout bowel movement" then saw blood on the tissue in stool.  No associated anorectal pain.  If she takes MiraLAX on a daily basis her bowel movements are well controlled without straining or bleeding.  She is concerned about taking a laxative on a daily basis.  She also notices increased constipation prior and during her menstrual cycle.  No diarrhea.  She stated her mother had stomach cancer which involved the gallbladder and possibly had colon cancer as well.  She stated her father died from colon cancer at the age of 54.   CBC Latest Ref Rng & Units 04/10/2016 03/02/2016 06/30/2010  WBC 4.0 - 10.5 K/uL 9.3 13.3(H) 10.9(H)  Hemoglobin 12.0 - 15.0 g/dL 14.7 14.7 14.7  Hematocrit 36.0 - 46.0 % 42.8 42.5 42.0  Platelets 150.0 - 400.0 K/uL 255.0 279.0 218    CMP Latest Ref Rng & Units 03/02/2016 03/20/2014 07/01/2010  Glucose 70 - 99 mg/dL 74 86 85  BUN 6 - 23 mg/dL 12 11 9  Creatinine 0.40 - 1.20 mg/dL 0.88 0.9 0.91  Sodium 135 - 145  mEq/L 139 137 138  Potassium 3.5 - 5.1 mEq/L 4.3 3.9 4.0  Chloride 96 - 112 mEq/L 105 107 106  CO2 19 - 32 mEq/L 27 21 22  Calcium 8.4 - 10.5 mg/dL 9.6 9.0 9.2  Total Protein 6.0 - 8.3 g/dL - - 6.5  Total Bilirubin 0.3 - 1.2 mg/dL - - 0.8  Alkaline Phos 39 - 117 units/L - - 66  AST 0 - 37 units/L - - 14  ALT 0 - 35 units/L - - 9     Colonoscopy 04/25/2013: 3 mm polyp removed with a ascending colon BENIGN POLYPOID COLONIC MUCOSA. NO INFLAMMATORY CHANGES, ADENOMATOUS CHANGE, DYSPLASIA OR MALIGNANCY. Colonoscopy recall 03/2022 (at age 36, which is 10 years prior to her mother being diagnosed)  Past Medical History:  Diagnosis Date   Boil    GERD (gastroesophageal reflux disease)    Menstrual irregularity    No past surgical history on file.  Social History:  Family History: Mother deceased at the age of 47 diagnosed with stomach cancer which involved the gallbladder and she possibly had colon cancer. Father died from colon cancer at the age  of 54. Paternal grandmother had breast cancer. No Known Allergies    Outpatient Encounter Medications as of 04/13/2021  Medication Sig   polyethylene glycol (MIRALAX / GLYCOLAX) 17 g packet Take 17 g by mouth daily as needed.     SUPREP BOWEL PREP KIT 17.5-3.13-1.6 GM/177ML SOLN Take 1 kit by mouth as directed. For colonoscopy prep   No facility-administered encounter medications on file as of 04/13/2021.   REVIEW OF SYSTEMS:  Gen: Denies fever, sweats or chills. No weight loss.  CV: Denies chest pain, palpitations or edema. Resp: Denies cough, shortness of breath of hemoptysis.  GI: See HPI.  Infrequent GERD symptoms. GU : + UTIs.  MS: Denies joint pain, muscles aches or weakness. Derm: Denies rash, itchiness, skin lesions or unhealing ulcers. Psych: Denies depression, anxiety, memory loss, suicidal ideation and confusion. Heme: Denies bruising, bleeding. Neuro:  Denies headaches, dizziness or paresthesias. Endo:  Denies any problems  with DM, thyroid or adrenal function.  PHYSICAL EXAM: BP 120/78   Pulse 72   Ht 5' 6" (1.676 m)   Wt 180 lb (81.6 kg)   LMP 03/15/2021 (Approximate)   BMI 29.05 kg/m  General: 35 year old female in no acute distress. Head: Normocephalic and atraumatic. Eyes:  Sclerae non-icteric, conjunctive pink. Ears: Normal auditory acuity. Mouth: Dentition intact. No ulcers or lesions.  Neck: Supple, no lymphadenopathy or thyromegaly.  Lungs: Clear bilaterally to auscultation without wheezes, crackles or rhonchi. Heart: Regular rate and rhythm. No murmur, rub or gallop appreciated.  Abdomen: Soft, nontender, non distended. No masses. No hepatosplenomegaly. Normoactive bowel sounds x 4 quadrants.  Rectal: No external hemorrhoids, thickened anal fold to the anterior anal area without fissure.  Small internal hemorrhoids palpated.  No blood or stool in the rectal vault.  No mass.  CMA June present during exam. Musculoskeletal: Symmetrical with no gross deformities. Skin: Warm and dry. No rash or lesions on visible extremities. Extremities: No edema. Neurological: Alert oriented x 4, no focal deficits.  Psychological:  Alert and cooperative. Normal mood and affect.  ASSESSMENT AND PLAN:  1) 35 year old female with rectal bleeding, small internal hemorrhoids. Father died from colon cancer at the age of 54. Mother possibly had colon cancer.  -Avoid constipation -Metamucil daily as tolerated -Okay to take MiraLAX daily indefinitely as tolerated -Apply a small amount of Desitin inside the anal opening and to the external anal area tid as needed for anal or hemorrhoidal irritation/bleeding.  -Increase water intake, increase dietary fiber as well -Colonoscopy benefits and risks discussed including risk with sedation, risk of bleeding, perforation and infection  -Request copy of CBC, CMP done by PCP few months ago -Patient will contact her office if rectal bleeding recurs  2) Constipation -See plan  above  Further recommendations to be determined after the above evaluation completed         CC:  No ref. provider found    

## 2021-04-13 NOTE — Patient Instructions (Addendum)
PROCEDURES: You have been scheduled for a colonoscopy. Please follow the written instructions given to you at your visit today. Please pick up your prep supplies at the pharmacy within the next 1-3 days. If you use inhalers (even only as needed), please bring them with you on the day of your procedure.  RECOMMENDATIONS: Miralax- Dissolve one capful in 8 ounces of water and drink before bed. Desitin: Apply a small amount to the external and internal anal area three times a day as needed. Increase your dietary fiber.  It was great seeing you today! Thank you for entrusting me with your care and choosing [facility].  Noralyn Pick, CRNP  The [place] [facility] providers would like to encourage you to use [facility] to communicate with providers for non-urgent requests or questions.  Due to long hold times on the telephone, sending your provider a message by [facility] may be faster and more efficient way to get a response. Please allow 48 business hours for a response.  Please remember that this is for non-urgent requests/questions.  If you are age 6 or older, your body mass index should be between 23-30. Your Body mass index is 29.05 kg/m. If this is out of the aforementioned range listed, please consider follow up with your Primary Care Provider.  If you are age 34 or younger, your body mass index should be between 19-25. Your Body mass index is 29.05 kg/m. If this is out of the aformentioned range listed, please consider follow up with your Primary Care Provider.

## 2021-04-14 NOTE — Progress Notes (Signed)
I agree with the above note, plan 

## 2021-05-16 ENCOUNTER — Encounter: Payer: Self-pay | Admitting: Gastroenterology

## 2021-05-18 ENCOUNTER — Telehealth: Payer: Self-pay | Admitting: Gastroenterology

## 2021-05-18 NOTE — Telephone Encounter (Signed)
Called patient and let her know it will be OK to proceed with her colonoscopy, even if she starts her period.

## 2021-05-18 NOTE — Telephone Encounter (Signed)
[place] the pt last saw [place].  Please send to her nurse, thank you

## 2021-05-18 NOTE — Telephone Encounter (Signed)
Patient called stating that she is worried that she my start her cycle between now and the date of her procedure. Seeking advice, please advise.

## 2021-05-20 ENCOUNTER — Encounter: Payer: Self-pay | Admitting: Gastroenterology

## 2021-05-20 ENCOUNTER — Other Ambulatory Visit: Payer: Self-pay

## 2021-05-20 ENCOUNTER — Ambulatory Visit (AMBULATORY_SURGERY_CENTER): Payer: BC Managed Care – PPO | Admitting: Gastroenterology

## 2021-05-20 VITALS — BP 115/69 | HR 84 | Temp 97.5°F | Resp 16 | Ht 66.0 in | Wt 180.0 lb

## 2021-05-20 DIAGNOSIS — D125 Benign neoplasm of sigmoid colon: Secondary | ICD-10-CM

## 2021-05-20 DIAGNOSIS — K635 Polyp of colon: Secondary | ICD-10-CM

## 2021-05-20 DIAGNOSIS — K648 Other hemorrhoids: Secondary | ICD-10-CM

## 2021-05-20 DIAGNOSIS — Z800 Family history of malignant neoplasm of digestive organs: Secondary | ICD-10-CM | POA: Diagnosis not present

## 2021-05-20 DIAGNOSIS — K625 Hemorrhage of anus and rectum: Secondary | ICD-10-CM

## 2021-05-20 DIAGNOSIS — D122 Benign neoplasm of ascending colon: Secondary | ICD-10-CM

## 2021-05-20 MED ORDER — SODIUM CHLORIDE 0.9 % IV SOLN: 500.0000 mL | Freq: Once | INTRAVENOUS | Status: DC

## 2021-05-20 NOTE — Progress Notes (Signed)
Pt's states no medical or surgical changes since previsit or office visit. 

## 2021-05-20 NOTE — Op Note (Addendum)
[facility] Patient Name: Caitlyn Buck Procedure Date: 05/20/2021 2:37 PM MRN: [id number] Endoscopist: Milus Banister , MD Age: 36 Referring MD:  Date of Birth: [birth year] Gender: Female Account #: [id number] Procedure:                Colonoscopy Indications:              Screening patient at increased risk: Family history                            of colorectal cancer in multiple 1st-degree                            relatives (mother and father) Medicines:                Monitored Anesthesia Care Procedure:                Pre-Anesthesia Assessment:                           - Prior to the procedure, a History and Physical                            was performed, and patient medications and                            allergies were reviewed. The patient's tolerance of                            previous anesthesia was also reviewed. The risks                            and benefits of the procedure and the sedation                            options and risks were discussed with the patient.                            All questions were answered, and informed consent                            was obtained. Prior Anticoagulants: The patient has                            taken no previous anticoagulant or antiplatelet                            agents. ASA Grade Assessment: II - A patient with                            mild systemic disease. After reviewing the risks                            and benefits, the patient was deemed in  satisfactory condition to undergo the procedure.                           After obtaining informed consent, the colonoscope                            was passed under direct vision. Throughout the                            procedure, the patient's blood pressure, pulse, and                            oxygen saturations were monitored continuously. The                            CF [id number] #[id number] was  introduced through the anus                            and advanced to the the cecum, identified by                            appendiceal orifice and ileocecal valve. The                            colonoscopy was performed without difficulty. The                            patient tolerated the procedure well. The quality                            of the bowel preparation was good. The ileocecal                            valve, appendiceal orifice, and rectum were                            photographed. Scope In: 2:42:26 PM Scope Out: 2:53:14 PM Scope Withdrawal Time: 0 hours 8 minutes 54 seconds  Total Procedure Duration: 0 hours 10 minutes 48 seconds  Findings:                 Three sessile polyps were found in the sigmoid                            colon and ascending colon. The polyps were 4 to 5                            mm in size. These polyps were removed with a cold                            snare. Resection and retrieval were complete.                           Internal hemorrhoids were found. The hemorrhoids  were small.                           The exam was otherwise without abnormality on                            direct and retroflexion views. Complications:            No immediate complications. Estimated blood loss:                            None. Estimated Blood Loss:     Estimated blood loss: none. Impression:               - Three 4 to 5 mm polyps in the sigmoid colon and                            in the ascending colon, removed with a cold snare.                            Resected and retrieved.                           - Internal hemorrhoids.                           - The examination was otherwise normal on direct                            and retroflexion views. Recommendation:           - Patient has a contact number available for                            emergencies. The signs and symptoms of potential                             delayed complications were discussed with the                            patient. Return to normal activities tomorrow.                            Written discharge instructions were provided to the                            patient.                           - Resume previous diet.                           - Continue present medications.                           - Await pathology results. Likely referral to  genetic counselors pending path review. Milus Banister, MD 05/20/2021 2:55:44 PM This report has been signed electronically.

## 2021-05-20 NOTE — Progress Notes (Signed)
PT taken to PACU. Monitors in place. VSS. Report given to RN. 

## 2021-05-20 NOTE — Progress Notes (Signed)
HPI: This is a woman with FH CRC, minor rectal bleeding   ROS: complete GI ROS as described in HPI, all other review negative.  Constitutional:  No unintentional weight loss   Past Medical History:  Diagnosis Date   Boil    GERD (gastroesophageal reflux disease)    Menstrual irregularity     No past surgical history on file.  Current Outpatient Medications  Medication Sig Dispense Refill   polyethylene glycol (MIRALAX / GLYCOLAX) 17 g packet Take 17 g by mouth daily as needed.     SUPREP BOWEL PREP KIT 17.5-3.13-1.6 GM/177ML SOLN Take 1 kit by mouth as directed. For colonoscopy prep 354 mL 0   No current facility-administered medications for this visit.    Allergies as of 05/20/2021   (No Known Allergies)    Family History  Problem Relation Age of Onset   Cancer Mother 40       colon cancer   Hypertension Mother    Diabetes Maternal Grandmother    Cancer Maternal Grandfather        colon cancer    Social History   Socioeconomic History   Marital status: Single    Spouse name: Not on file   Number of children: Not on file   Years of education: Not on file   Highest education level: Not on file  Occupational History   Occupation: work    Fish farm manager: [employer]  Tobacco Use   Smoking status: Never   Smokeless tobacco: Never  Substance and Sexual Activity   Alcohol use: Yes    Comment: occ. 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 female with [place] CRC, minor rectal bleeding  Colonoscoyp today  Care is appropriate for the ambulatory setting.  Owens Loffler, MD [facility] 05/20/2021, 1:32 PM

## 2021-05-20 NOTE — Patient Instructions (Signed)
YOU HAD AN ENDOSCOPIC PROCEDURE TODAY AT [facility]:   Refer to the procedure report that was given to you for any specific questions about what was found during the examination.  If the procedure report does not answer your questions, please call your gastroenterologist to clarify.  If you requested that your care partner not be given the details of your procedure findings, then the procedure report has been included in a sealed envelope for you to review at your convenience later.  **Handout given on polyps**  YOU SHOULD EXPECT: Some feelings of bloating in the abdomen. Passage of more gas than usual.  Walking can help get rid of the air that was put into your GI tract during the procedure and reduce the bloating. If you had a lower endoscopy (such as a colonoscopy or flexible sigmoidoscopy) you may notice spotting of blood in your stool or on the toilet paper. If you underwent a bowel prep for your procedure, you may not have a normal bowel movement for a few days.  Please Note:  You might notice some irritation and congestion in your nose or some drainage.  This is from the oxygen used during your procedure.  There is no need for concern and it should clear up in a day or so.  SYMPTOMS TO REPORT IMMEDIATELY:  Following lower endoscopy (colonoscopy or flexible sigmoidoscopy):  Excessive amounts of blood in the stool  Significant tenderness or worsening of abdominal pains  Swelling of the abdomen that is new, acute  Fever of 100F or higher  For urgent or emergent issues, a gastroenterologist can be reached at any hour by calling [phone]. Do not use MyChart messaging for urgent concerns.    DIET:  We do recommend a small meal at first, but then you may proceed to your regular diet.  Drink plenty of fluids but you should avoid alcoholic beverages for 24 hours.  ACTIVITY:  You should plan to take it easy for the rest of today and you should NOT DRIVE or use heavy  machinery until tomorrow (because of the sedation medicines used during the test).    FOLLOW UP: Our staff will call the number listed on your records 48-72 hours following your procedure to check on you and address any questions or concerns that you may have regarding the information given to you following your procedure. If we do not reach you, we will leave a message.  We will attempt to reach you two times.  During this call, we will ask if you have developed any symptoms of COVID 19. If you develop any symptoms (ie: fever, flu-like symptoms, shortness of breath, cough etc.) before then, please call [phone].  If you test positive for Covid 19 in the 2 weeks post procedure, please call and report this information to [place].    If any biopsies were taken you will be contacted by phone or by letter within the next 1-3 weeks.  Please call us at [phone] if you have not heard about the biopsies in 3 weeks.    SIGNATURES/CONFIDENTIALITY: You and/or your care partner have signed paperwork which will be entered into your electronic medical record.  These signatures attest to the fact that that the information above on your After Visit Summary has been reviewed and is understood.  Full responsibility of the confidentiality of this discharge information lies with you and/or your care-partner.

## 2021-05-20 NOTE — Progress Notes (Signed)
Called to room to assist during endoscopic procedure.  Patient ID and intended procedure confirmed with present staff. Received instructions for my participation in the procedure from the performing physician.  

## 2021-05-24 ENCOUNTER — Telehealth: Payer: Self-pay | Admitting: *Deleted

## 2021-05-24 NOTE — Telephone Encounter (Signed)
°  Follow up Call-  Call back number 05/20/2021  Post procedure Call Back phone  # [phone]  Permission to leave phone message Yes  Some recent data might be hidden     Patient questions:  Do you have a fever, pain , or abdominal swelling? No. Pain Score  0 *  Have you tolerated food without any problems? Yes.    Have you been able to return to your normal activities? Yes.    Do you have any questions about your discharge instructions: Diet   No. Medications  No. Follow up visit  No.  Do you have questions or concerns about your Care? No.  Actions: * If pain score is 4 or above: No action needed, pain <4.

## 2021-05-27 ENCOUNTER — Encounter: Payer: Self-pay | Admitting: Gastroenterology

## 2022-03-03 IMAGING — CT CT HEAD W/O CM
4 series · 17 of 47 positions shown, 19 images · non-contrast
Comparison: None.

CLINICAL DATA: Per ed notes: Per multiple family members and pt, pt
c/o sudden onset worst HA of her life approx 2 hrs ago. States she
seems she isn't able to answer some are

EXAM:
CT HEAD WITHOUT CONTRAST
TECHNIQUE: Contiguous axial images were obtained from the base of the skull
through the vertex without intravenous contrast.

[Series 3: head wo · axial · 0.47mm/px · z∈[-102,+18]mm · 7 of 32 slices shown, 9 images]
[im 4/32  brain]
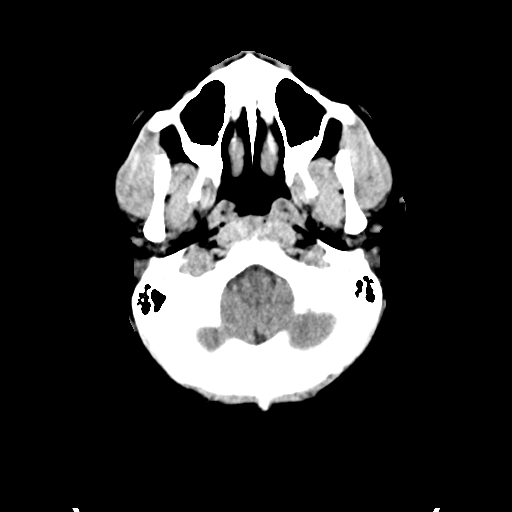
[im 4/32  bone]
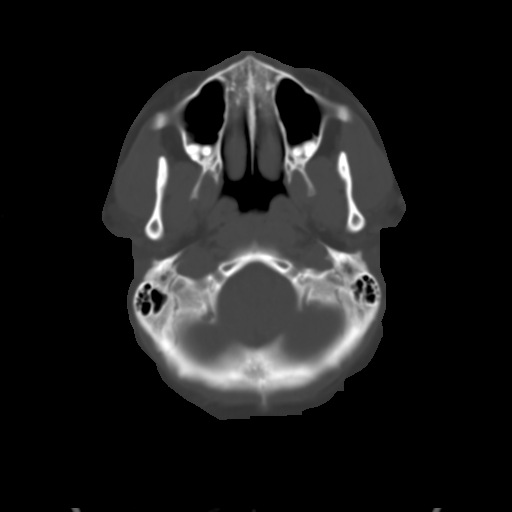
[im 8/32  brain]
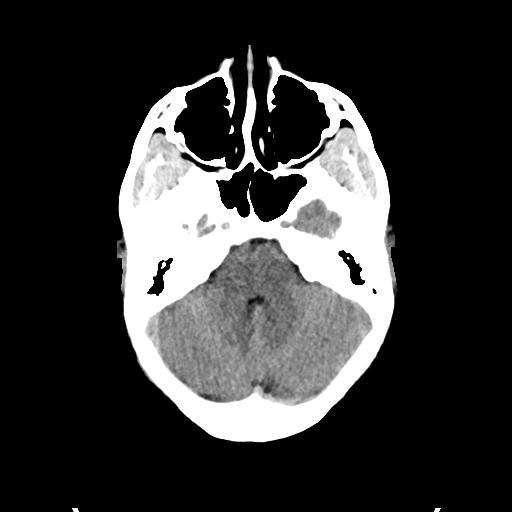
[im 12/32  brain]
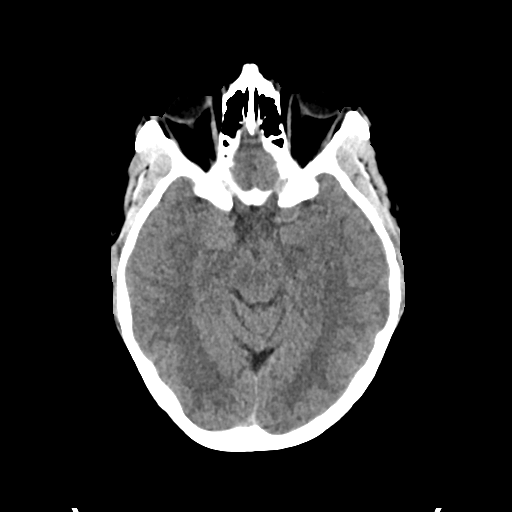
[im 16/32  brain]
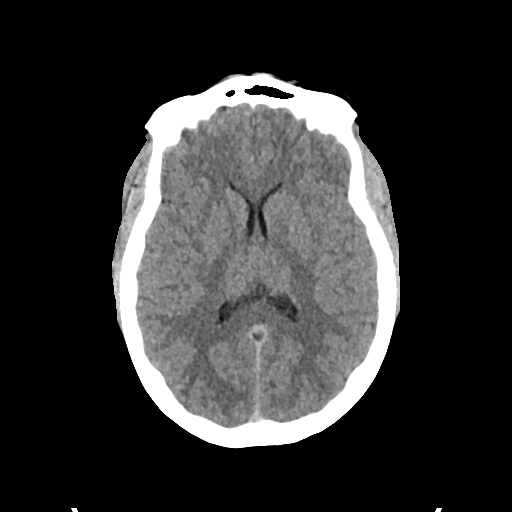
[im 20/32  brain]
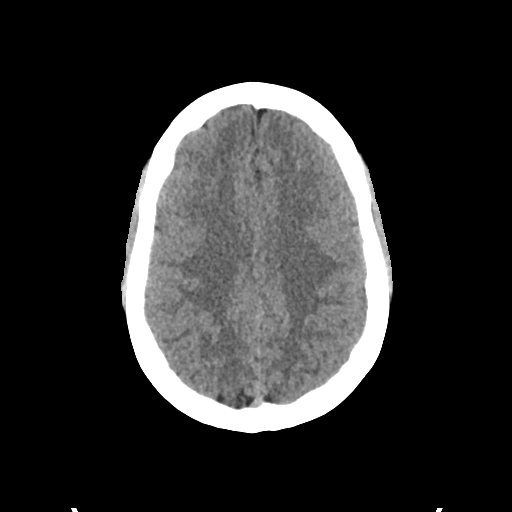
[im 20/32  bone]
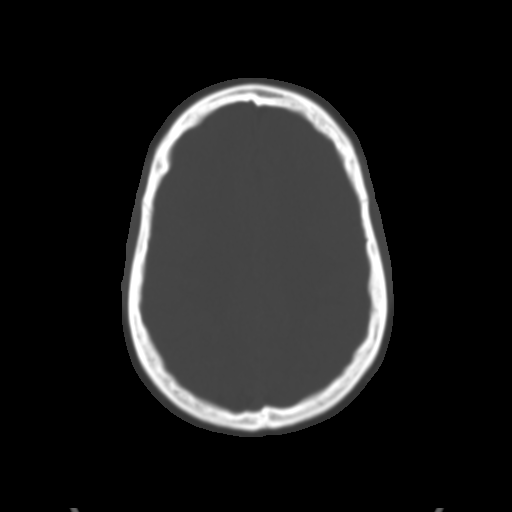
[im 24/32  brain]
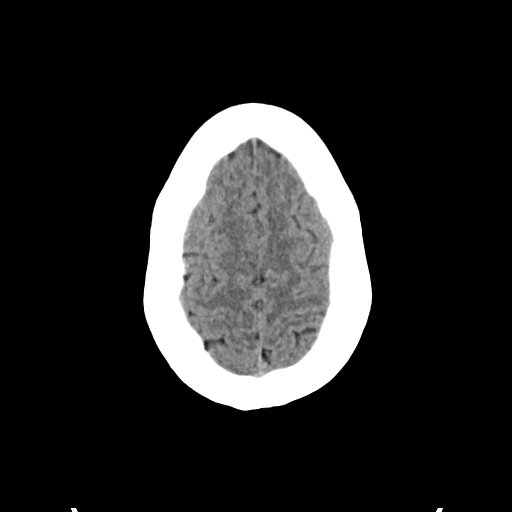
[im 28/32  brain]
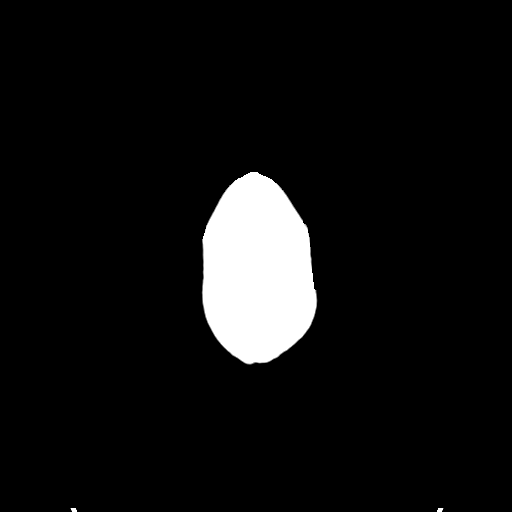

[Series 4: head bone · axial · 0.47mm/px · z∈[-104,-48]mm · 4 of 79 slices shown]
[im 8/79  bone]
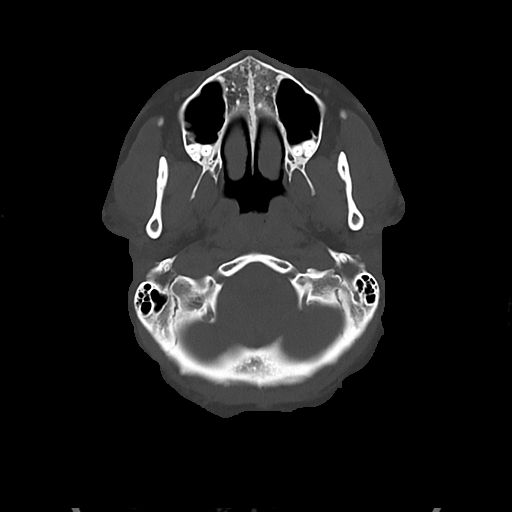
[im 16/79  bone]
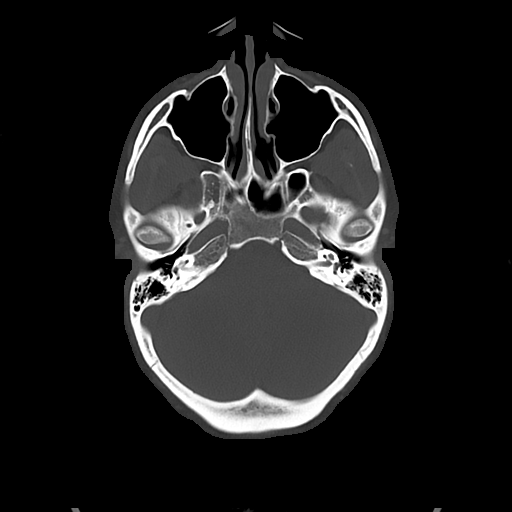
[im 24/79  bone]
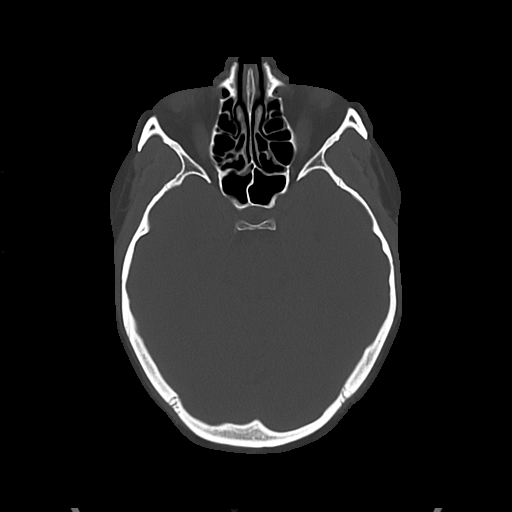
[im 36/79  bone]
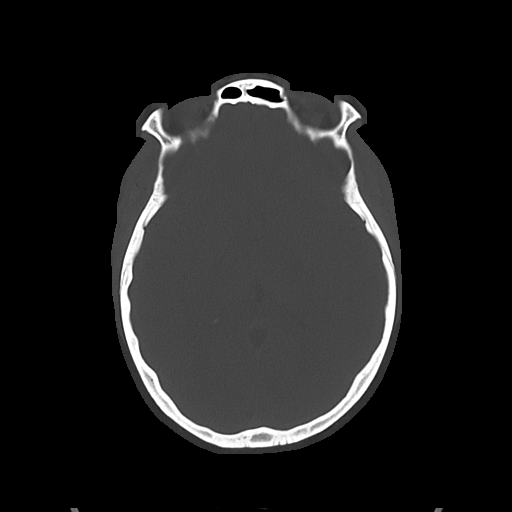

[Series 5: cor soft · coronal · 0.42mm/px · 3 of 71 slices shown]
[im 24/71  brain]
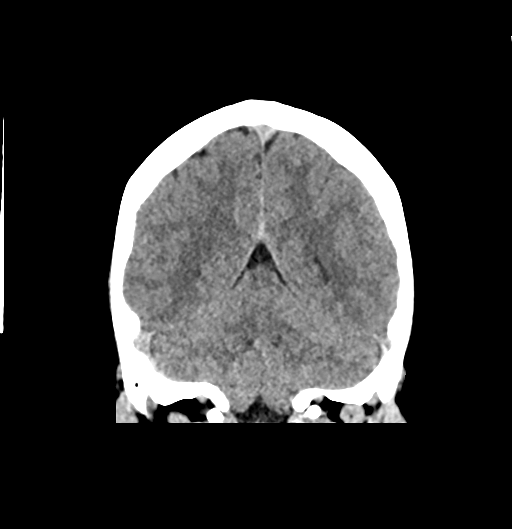
[im 32/71  brain]
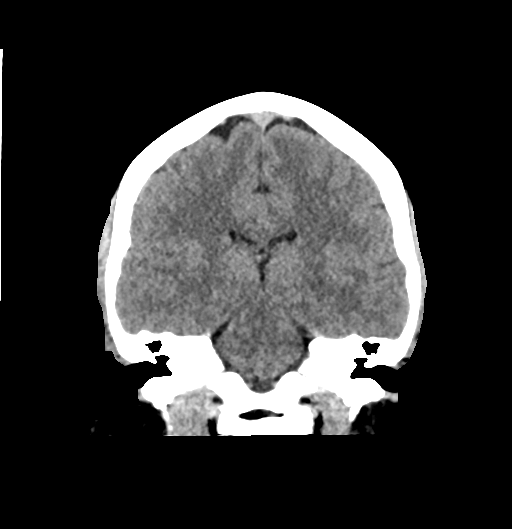
[im 39/71  brain]
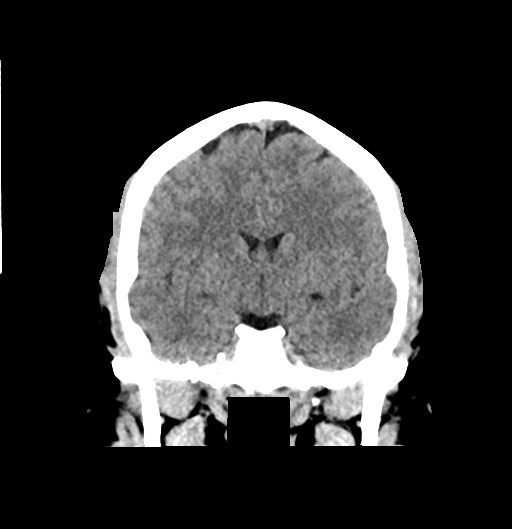

[Series 6: sag soft · sagittal · 0.37mm/px · 3 of 59 slices shown]
[im 20/59  brain]
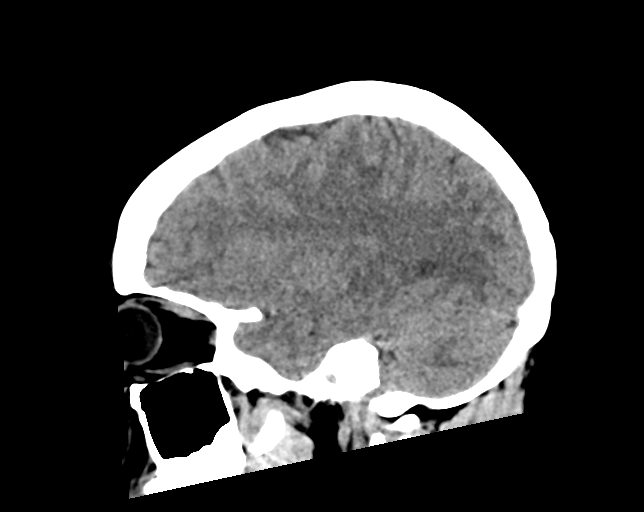
[im 30/59  brain]
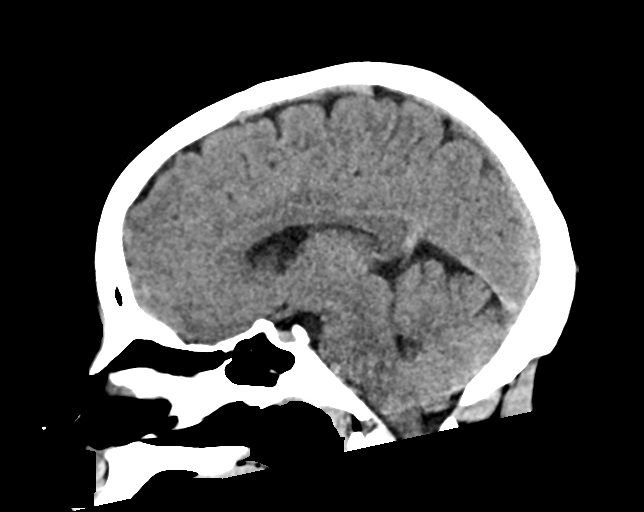
[im 39/59  brain]
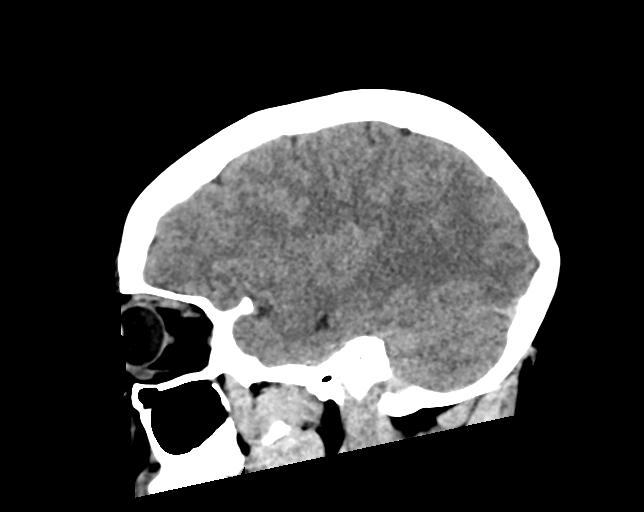

[17 of 47 positions shown; findings below may reference images not displayed]

FINDINGS: Brain: No evidence of acute infarction, hemorrhage, hydrocephalus,
extra-axial collection or mass lesion/mass effect.

Vascular: No hyperdense vessel or unexpected calcification.

Skull: Normal. Negative for fracture or focal lesion.

Sinuses/Orbits: No acute finding.

Other: None.
IMPRESSION: No acute intracranial pathology.
